# Patient Record
Sex: Female | Born: 1940 | Race: White | Hispanic: No | Marital: Married | Smoking: Former smoker
Health system: Southern US, Community
[De-identification: ages and names within clinical notes are randomized; demographics above are authoritative.]

---

## 2009-04-20 ENCOUNTER — Encounter: Admission: RE | Admit: 2009-04-20 | Discharge: 2009-04-20 | Payer: Self-pay | Admitting: Neurosurgery

## 2009-06-08 ENCOUNTER — Inpatient Hospital Stay (HOSPITAL_COMMUNITY): Admission: RE | Admit: 2009-06-08 | Discharge: 2009-06-12 | Payer: Self-pay | Admitting: Neurosurgery

## 2009-11-10 ENCOUNTER — Emergency Department (HOSPITAL_COMMUNITY): Admission: EM | Admit: 2009-11-10 | Discharge: 2009-11-10 | Payer: Self-pay | Admitting: Emergency Medicine

## 2009-11-15 ENCOUNTER — Encounter: Admission: RE | Admit: 2009-11-15 | Discharge: 2009-11-15 | Payer: Self-pay | Admitting: Pulmonary Disease

## 2009-11-15 ENCOUNTER — Ambulatory Visit: Payer: Self-pay | Admitting: Pulmonary Disease

## 2009-11-15 DIAGNOSIS — J984 Other disorders of lung: Secondary | ICD-10-CM | POA: Insufficient documentation

## 2009-11-15 DIAGNOSIS — E785 Hyperlipidemia, unspecified: Secondary | ICD-10-CM

## 2009-11-15 DIAGNOSIS — J309 Allergic rhinitis, unspecified: Secondary | ICD-10-CM | POA: Insufficient documentation

## 2009-11-15 DIAGNOSIS — R0602 Shortness of breath: Secondary | ICD-10-CM

## 2009-11-15 DIAGNOSIS — I10 Essential (primary) hypertension: Secondary | ICD-10-CM | POA: Insufficient documentation

## 2010-03-08 ENCOUNTER — Encounter: Admission: RE | Admit: 2010-03-08 | Discharge: 2010-03-08 | Payer: Self-pay | Admitting: Neurosurgery

## 2010-03-25 ENCOUNTER — Encounter: Admission: RE | Admit: 2010-03-25 | Discharge: 2010-03-25 | Payer: Self-pay | Admitting: Neurosurgery

## 2010-04-05 ENCOUNTER — Inpatient Hospital Stay (HOSPITAL_COMMUNITY): Admission: RE | Admit: 2010-04-05 | Discharge: 2010-04-08 | Payer: Self-pay | Admitting: Neurosurgery

## 2010-06-02 ENCOUNTER — Emergency Department (HOSPITAL_COMMUNITY): Admission: EM | Admit: 2010-06-02 | Discharge: 2010-06-02 | Payer: Self-pay | Admitting: Emergency Medicine

## 2010-07-10 ENCOUNTER — Encounter: Admission: RE | Admit: 2010-07-10 | Discharge: 2010-07-10 | Payer: Self-pay | Admitting: Neurosurgery

## 2010-07-28 IMAGING — CR DG CHEST 2V
2 series · 2 of 2 positions shown · non-contrast
Comparison: None

CLINICAL DATA: Pre admit for lumbar spondylosis

CHEST - 2 VIEW

[view not recorded (1 of 2)]
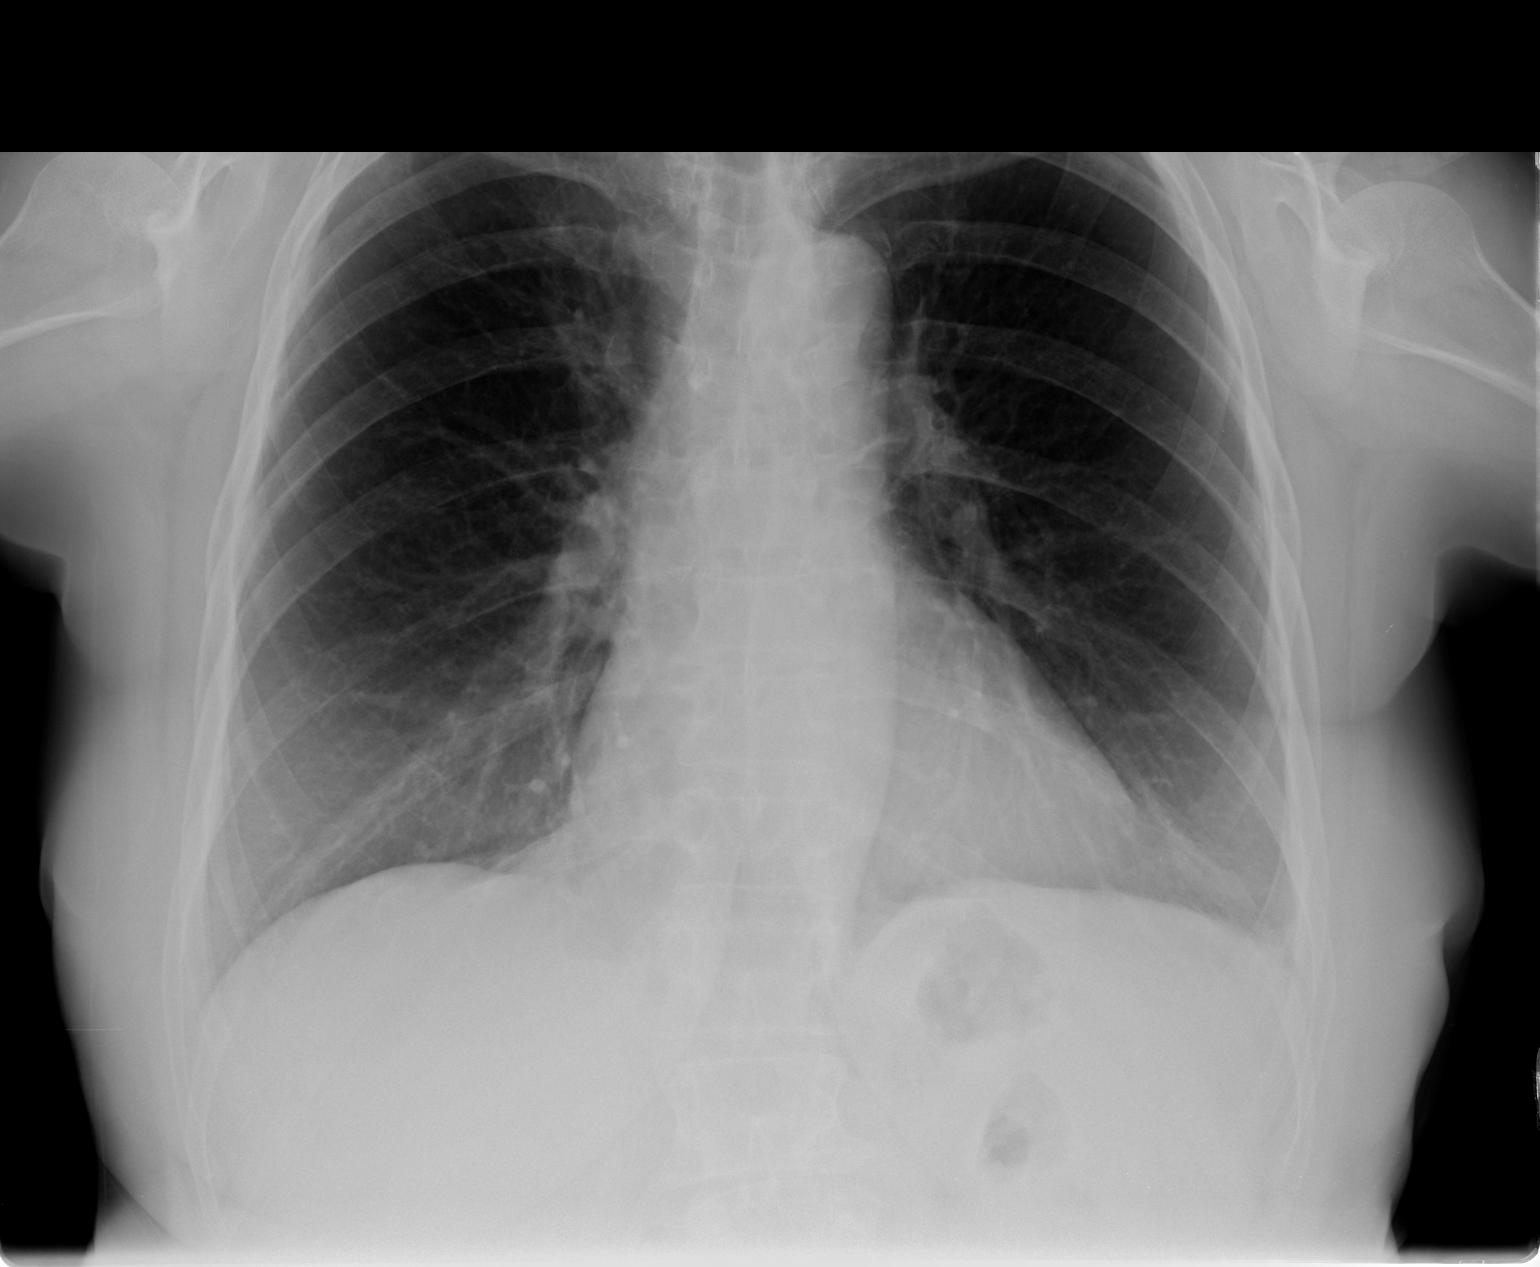

[view not recorded (2 of 2)]
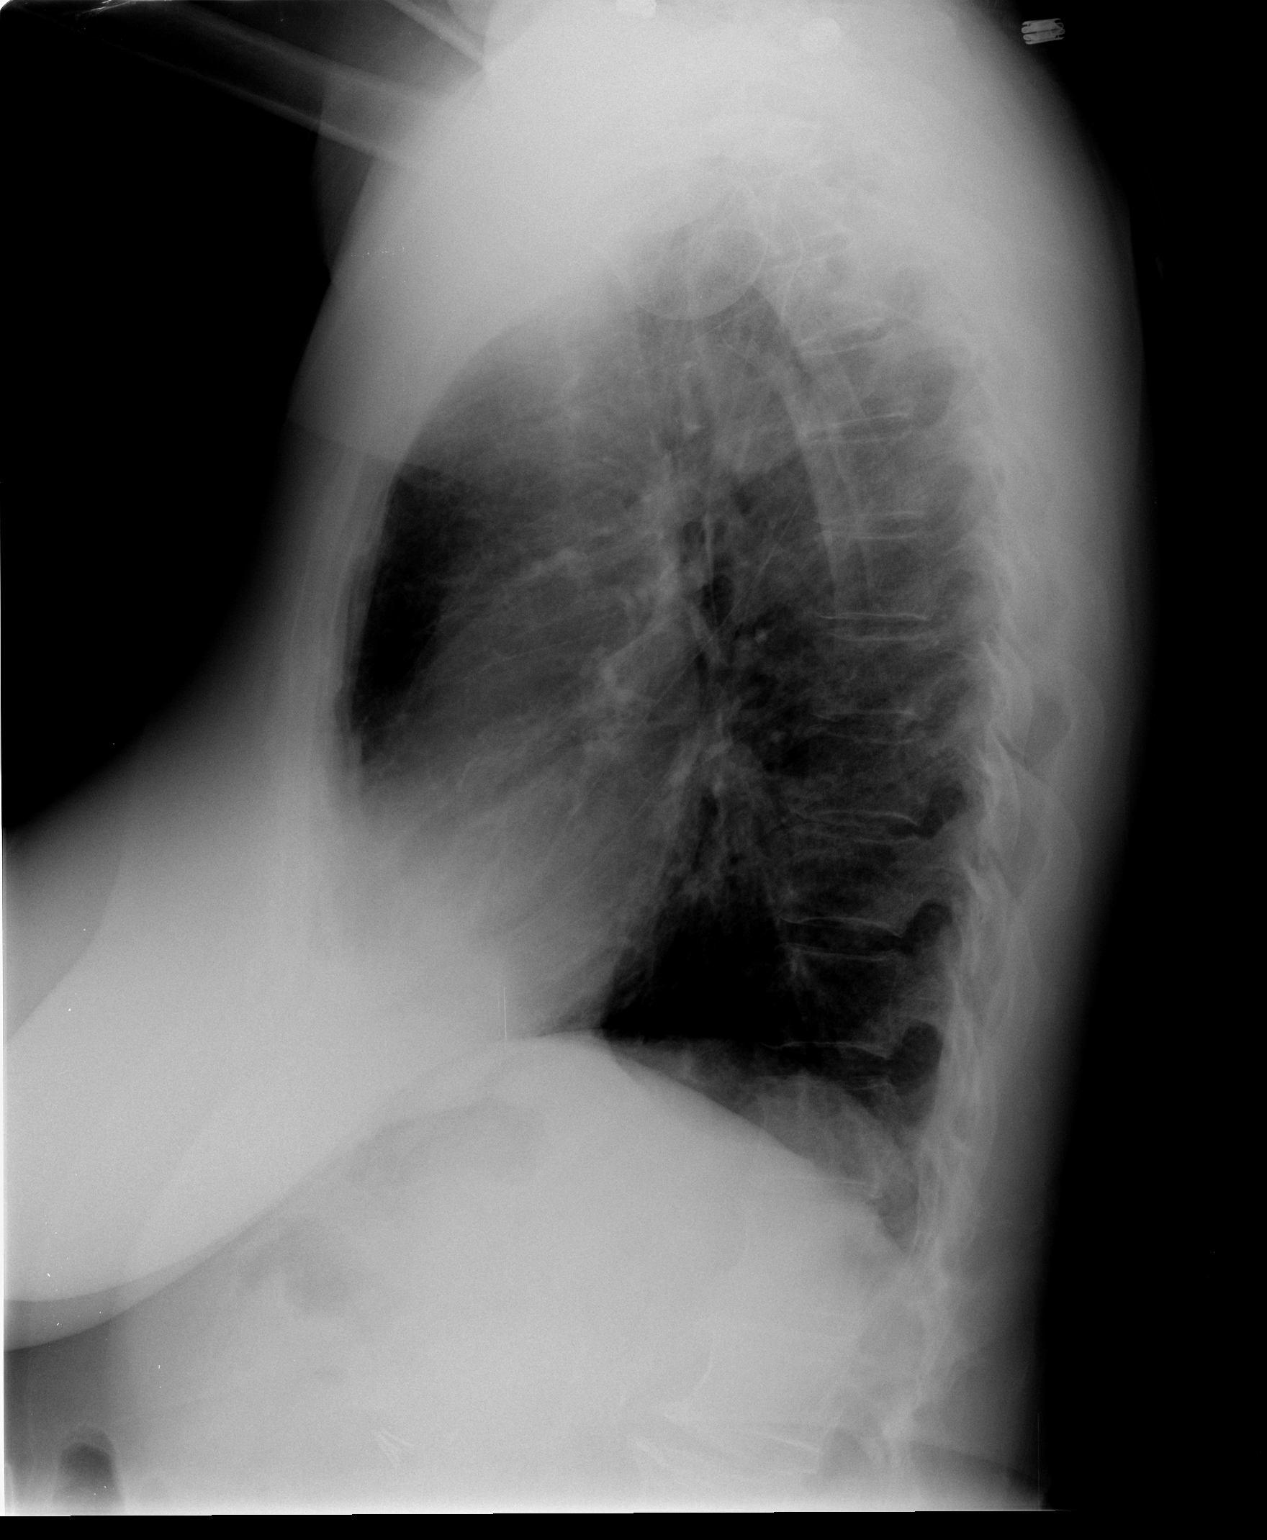

[2 of 2 positions shown; findings below may reference images not displayed]

FINDINGS: Heart size upper normal.  Lungs clear.  Osseous
structures intact.  No pleural fluid.
IMPRESSION: No active disease.

## 2010-08-01 IMAGING — RF DG LUMBAR SPINE 2-3V
1 series · 2 of 2 positions shown · non-contrast
Comparison: None

CLINICAL DATA: L4-L5 anterolateral fusion.

LUMBAR SPINE - 2-3 VIEW

[Series 1: run · 2 of 2 slices shown]
[im 1/2]
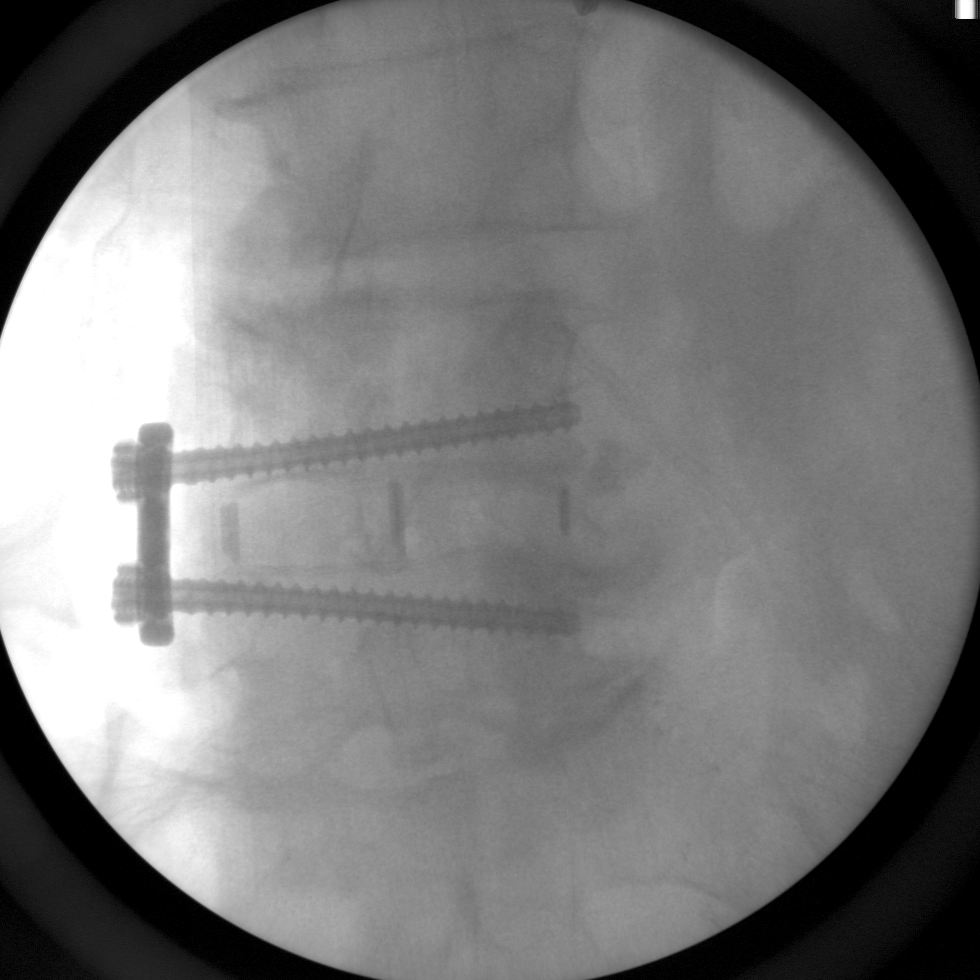
[im 2/2]
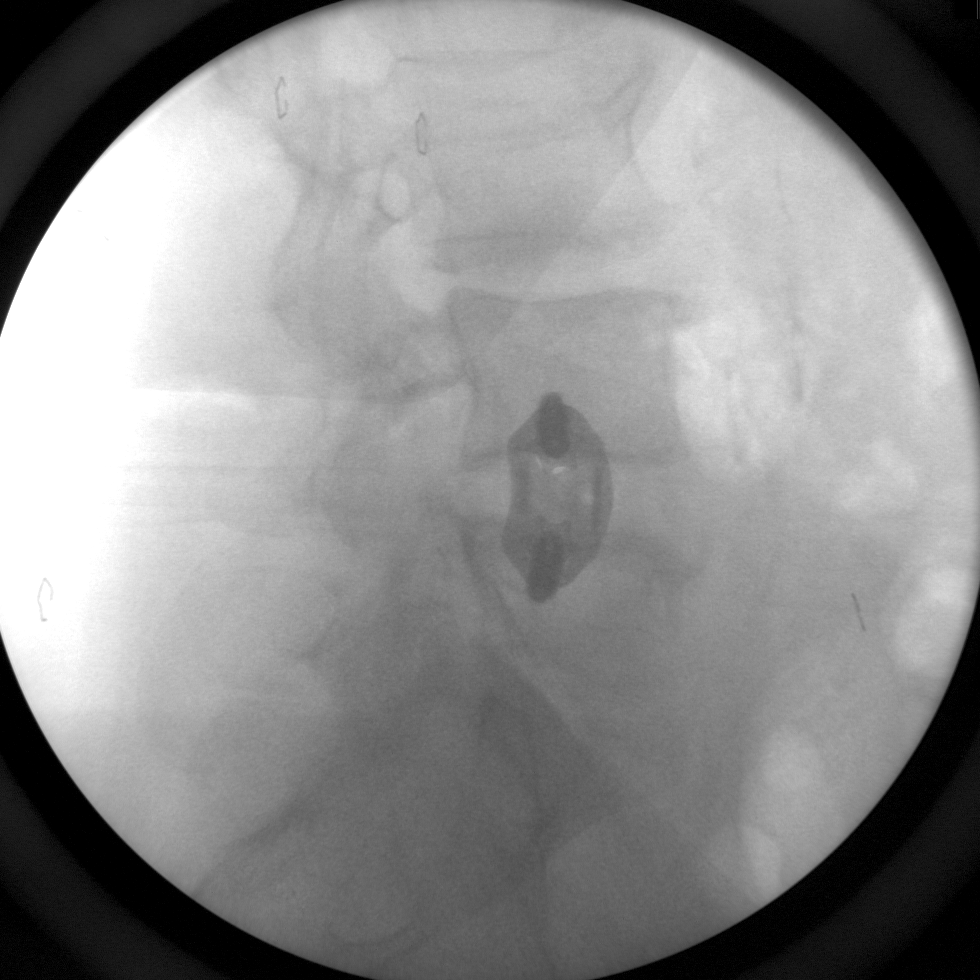

[2 of 2 positions shown; findings below may reference images not displayed]

FINDINGS: Spot AP and lateral fluoroscopic images demonstrate
anterolateral fusion at L4-L5 with an interbody spacer and a
lateral plate and screws.  The hardware appears well positioned.
No complications are evident.
IMPRESSION: Intraoperative views following L4-L5 anterolateral fusion.

## 2010-11-19 NOTE — Assessment & Plan Note (Signed)
Summary: consult for pulmonary nodule and dyspnea   Copy to:  Foye Deer Primary Provider/Referring Provider:  Foye Deer  CC:  Pulmonary Consult.  History of Present Illness: The pt is a 70y/o female who comes in today for evaluation of a pulmonary nodule and dyspnea.  She had a "cold" 4 weeks ago with cough, congestion, and purulence.  She has been treated with abx times 2, and also steroid therapy.  Despite this, she has been having significant dyspnea at rest and with exertion.  She is not coughing or bringing up mucus, but feels that she cannot get her breath at times.  She denies any hemoptysis or chest pain.  She does have a lot of postnasal drip, and feels that she is choking in her throat.  She denies any h/o cardiac disease.  She has been seen in ER, where cxr shows a ?RUL nodular density not obviously present on prior films.  She denies ever living in [place] or [place], and tells me that her health maintenance is up to date.  She has never had TB exposure, and has had a negative ppd in the past.  She has a smoking history of 26yrs, but none the last 10 years.  Preventive Screening-Counseling & Management  Alcohol-Tobacco     Smoking Status: quit  Current Medications (verified): 1)  Ventolin Hfa 108 (90 Base) Mcg/act  Aers (Albuterol Sulfate) .Marland Kitchen.. 1-2 Puffs Every 4-6 Hours As Needed 2)  Labetalol Hcl 200 Mg Tabs (Labetalol Hcl) .... Take 1 Tablet By Mouth Three Times A Day 3)  Hydrochlorothiazide 25 Mg Tabs (Hydrochlorothiazide) .... Take 1 1/2 Tabs By Mouth Daily 4)  Klor-Con M20 20 Meq Cr-Tabs (Potassium Chloride Crys Cr) .... Take 1 Tablet By Mouth Once A Day 5)  Lexapro 20 Mg Tabs (Escitalopram Oxalate) .... Take 1 Tablet By Mouth Once A Day 6)  Crestor 40 Mg Tabs (Rosuvastatin Calcium) .... Take 1 Tablet By Mouth Once A Day 7)  Vitamin C 500 Mg Tabs (Ascorbic Acid) .... Take 1 Tablet By Mouth Once A Day  Allergies (verified): 1)  ! Benadryl 2)  ! *  Promethazine 3)  ! * Cefzil  Past History:  Past Medical History:   HYPERTENSION (ICD-401.9) HYPERLIPIDEMIA (ICD-272.4) ALLERGIC RHINITIS (ICD-477.9)    Past Surgical History: gall bladder 1991 back surgery Aug 2010 Sinus surgery approx early 1990s Hysterectomy approx 1986  Family History: Reviewed history and no changes required. allergies: daughter cancer: brother (lungs, brain) sister (kidney)   Social History: Reviewed history and no changes required. Patient states former smoker.  started at age 16s.  less than 1/2 ppd.  quit 2001. pt is married. pt has children. pt is a retired Associate Professor.  Smoking Status:  quit  Review of Systems       The patient complains of shortness of breath with activity, productive cough, non-productive cough, indigestion, loss of appetite, nasal congestion/difficulty breathing through nose, sneezing, anxiety, and depression.  The patient denies shortness of breath at rest, coughing up blood, chest pain, irregular heartbeats, acid heartburn, weight change, abdominal pain, difficulty swallowing, sore throat, tooth/dental problems, headaches, itching, ear ache, hand/feet swelling, joint stiffness or pain, rash, change in color of mucus, and fever.    Vital Signs:  Patient profile:   70 year old female Height:      70 inches Weight:      175.13 pounds BMI:     25.22 O2 Sat:      99 % on Room air  Temp:     97.4 degrees F oral Pulse rate:   59 / minute BP sitting:   124 / 74  (left arm) Cuff size:   regular  Vitals Entered By: Arman Filter LPN (November 15, 2009 3:08 PM)  O2 Flow:  Room air CC: Pulmonary Consult Comments Medications reviewed with patient Arman Filter LPN  November 15, 2009 3:08 PM    Physical Exam  General:  ow female in nad Eyes:  PERRLA and EOMI.   Nose:  patent without discharge Mouth:  clear, no abnormalities. Neck:  soft and nontender, bs+ Lungs:  totally clear to auscultation Heart:  rrr, no  mrg Abdomen:  soft and nontender, bs+ Extremities:  no significant edema, no cyanosis pulses intact distally Neurologic:  alert and oriented,moves all 4.   Impression & Recommendations:  Problem # 1:  DYSPNEA (ICD-786.05) the pt has no obvious pulmonary reason for her sob.  She has a clear cxr, clear lung fields on exam, excellent sats on room air, and normal spirometry today.  This may be a "perception of breathing" issue, and she later told me that she has panic attacks and takes lexapro and valium.  The only other consideration is whether she could have a cardiac issue.  I will leave that to her primary md.  Problem # 2:  PULMONARY NODULE (ICD-518.89) the pt has a rul nodule on cxr that is not obvious on other films.  She tells me that her health maintenance is up to date, but does have a moderate h/o tobacco abuse.  At this point, she needs a ct chest for better characterization.  Will then decide whether she needs surveillance vs. further workup.  Medications Added to Medication List This Visit: 1)  Ventolin Hfa 108 (90 Base) Mcg/act Aers (Albuterol sulfate) .Marland Kitchen.. 1-2 puffs every 4-6 hours as needed 2)  Labetalol Hcl 200 Mg Tabs (Labetalol hcl) .... Take 1 tablet by mouth three times a day 3)  Hydrochlorothiazide 25 Mg Tabs (Hydrochlorothiazide) .... Take 1 1/2 tabs by mouth daily 4)  Klor-con M20 20 Meq Cr-tabs (Potassium chloride crys cr) .... Take 1 tablet by mouth once a day 5)  Lexapro 20 Mg Tabs (Escitalopram oxalate) .... Take 1 tablet by mouth once a day 6)  Crestor 40 Mg Tabs (Rosuvastatin calcium) .... Take 1 tablet by mouth once a day 7)  Vitamin C 500 Mg Tabs (Ascorbic acid) .... Take 1 tablet by mouth once a day  Other Orders: Consultation Level V (16109) Radiology Referral (Radiology) Spirometry w/Graph (60454)  Patient Instructions: 1)  will schedule for ct chest.  I will call with results when available. 2)  can take chlorpheniramine 8mg  at bedtime for postnasal  drip 3)  I do not see a pulmonary reason for your shortness of breath at this time.   CardioPerfect Spirometry  ID: [id number] Patient: Sandy Wood, Sandy Wood DOB: 06/09/1941 Age: 70 Years Old Sex: Female Race: White Height: 70 Weight: 175.13 Status: Confirmed Recorded: 11/15/2009 4:00 PM  Parameter  Measured Predicted %Predicted FVC     3.29        3.82        86.10 FEV1     2.32        2.92        79.30 FEV1%   70.36        76.57        91.90 PEF    5.79        6.78  85.50   Interpretation: spirometry is normal

## 2011-01-03 LAB — URINALYSIS, ROUTINE W REFLEX MICROSCOPIC
Glucose, UA: NEGATIVE mg/dL
Ketones, ur: NEGATIVE mg/dL
Protein, ur: NEGATIVE mg/dL

## 2011-01-03 LAB — CBC
HCT: 41.1 % (ref 36.0–46.0)
Hemoglobin: 14.4 g/dL (ref 12.0–15.0)
MCHC: 35 g/dL (ref 30.0–36.0)
WBC: 12.8 10*3/uL — ABNORMAL HIGH (ref 4.0–10.5)

## 2011-01-03 LAB — POCT CARDIAC MARKERS: Myoglobin, poc: 73 ng/mL (ref 12–200)

## 2011-01-03 LAB — COMPREHENSIVE METABOLIC PANEL
ALT: 20 U/L (ref 0–35)
Alkaline Phosphatase: 111 U/L (ref 39–117)
CO2: 25 mEq/L (ref 19–32)
GFR calc non Af Amer: >60 mL/min (ref >60)
Glucose, Bld: 109 mg/dL — ABNORMAL HIGH (ref 70–99)
Potassium: 2.9 mEq/L — ABNORMAL LOW (ref 3.5–5.1)
Sodium: 136 mEq/L (ref 135–145)
Total Bilirubin: 0.5 mg/dL (ref 0.3–1.2)

## 2011-01-03 LAB — URINE MICROSCOPIC-ADD ON

## 2011-01-03 LAB — DIFFERENTIAL
Basophils Relative: 0 % (ref 0–1)
Eosinophils Absolute: 0.1 10*3/uL (ref 0.0–0.7)
Neutrophils Relative %: 77 % (ref 43–77)

## 2011-01-05 LAB — COMPREHENSIVE METABOLIC PANEL
ALT: 20 U/L (ref 0–35)
AST: 21 U/L (ref 0–37)
Albumin: 4.2 g/dL (ref 3.5–5.2)
Alkaline Phosphatase: 79 U/L (ref 39–117)
Alkaline Phosphatase: 87 U/L (ref 39–117)
BUN: 9 mg/dL (ref 6–23)
Calcium: 9.8 mg/dL (ref 8.4–10.5)
Chloride: 103 mEq/L (ref 96–112)
GFR calc Af Amer: >60 mL/min (ref >60)
Glucose, Bld: 102 mg/dL — ABNORMAL HIGH (ref 70–99)
Potassium: 3.4 mEq/L — ABNORMAL LOW (ref 3.5–5.1)
Potassium: 3.5 mEq/L (ref 3.5–5.1)
Sodium: 137 mEq/L (ref 135–145)
Total Bilirubin: 0.4 mg/dL (ref 0.3–1.2)
Total Protein: 6.2 g/dL (ref 6.0–8.3)

## 2011-01-05 LAB — POCT CARDIAC MARKERS
CKMB, poc: 1.2 ng/mL (ref 1.0–8.0)
Myoglobin, poc: 71.2 ng/mL (ref 12–200)
Troponin i, poc: <0.05 ng/mL (ref 0.00–0.09)

## 2011-01-05 LAB — PROTIME-INR: INR: 0.96 (ref 0.00–1.49)

## 2011-01-05 LAB — DIFFERENTIAL
Basophils Relative: 1 % (ref 0–1)
Eosinophils Absolute: 0.1 10*3/uL (ref 0.0–0.7)
Eosinophils Relative: 1 % (ref 0–5)
Eosinophils Relative: 1 % (ref 0–5)
Lymphocytes Relative: 16 % (ref 12–46)
Lymphs Abs: 1.7 10*3/uL (ref 0.7–4.0)
Lymphs Abs: 1.9 10*3/uL (ref 0.7–4.0)
Monocytes Absolute: 0.9 10*3/uL (ref 0.1–1.0)
Monocytes Relative: 7 % (ref 3–12)
Neutro Abs: 7.7 10*3/uL (ref 1.7–7.7)
Neutrophils Relative %: 75 % (ref 43–77)

## 2011-01-05 LAB — CBC
HCT: 41.6 % (ref 36.0–46.0)
HCT: 39.2 % (ref 36.0–46.0)
Hemoglobin: 13.8 g/dL (ref 12.0–15.0)
MCV: 91.1 fL (ref 78.0–100.0)
Platelets: 196 10*3/uL (ref 150–400)
Platelets: 211 10*3/uL (ref 150–400)
RDW: 12.9 % (ref 11.5–15.5)
RDW: 13.1 % (ref 11.5–15.5)
WBC: 10.3 10*3/uL (ref 4.0–10.5)
WBC: 13.4 10*3/uL — ABNORMAL HIGH (ref 4.0–10.5)

## 2011-01-05 LAB — URINALYSIS, ROUTINE W REFLEX MICROSCOPIC
Ketones, ur: NEGATIVE mg/dL
Nitrite: NEGATIVE
Specific Gravity, Urine: 1.008 (ref 1.005–1.030)
pH: 6 (ref 5.0–8.0)

## 2011-01-05 LAB — BASIC METABOLIC PANEL
BUN: 6 mg/dL (ref 6–23)
Calcium: 9.7 mg/dL (ref 8.4–10.5)
GFR calc non Af Amer: >60 mL/min (ref >60)
Potassium: 3.4 mEq/L — ABNORMAL LOW (ref 3.5–5.1)
Sodium: 135 mEq/L (ref 135–145)

## 2011-01-05 LAB — BRAIN NATRIURETIC PEPTIDE: Pro B Natriuretic peptide (BNP): 84 pg/mL (ref 0.0–100.0)

## 2011-01-05 LAB — URINE MICROSCOPIC-ADD ON

## 2011-01-05 LAB — TYPE AND SCREEN

## 2011-01-05 LAB — GLUCOSE, CAPILLARY: Glucose-Capillary: 113 mg/dL — ABNORMAL HIGH (ref 70–99)

## 2011-01-05 LAB — SURGICAL PCR SCREEN: MRSA, PCR: NEGATIVE

## 2011-01-25 LAB — COMPREHENSIVE METABOLIC PANEL
ALT: 21 U/L (ref 0–35)
Alkaline Phosphatase: 82 U/L (ref 39–117)
BUN: 5 mg/dL — ABNORMAL LOW (ref 6–23)
Chloride: 96 mEq/L (ref 96–112)
Glucose, Bld: 87 mg/dL (ref 70–99)
Potassium: 3.8 mEq/L (ref 3.5–5.1)
Sodium: 134 mEq/L — ABNORMAL LOW (ref 135–145)
Total Bilirubin: 0.7 mg/dL (ref 0.3–1.2)
Total Protein: 6.1 g/dL (ref 6.0–8.3)

## 2011-01-25 LAB — DIFFERENTIAL
Basophils Absolute: 0 10*3/uL (ref 0.0–0.1)
Basophils Relative: 1 % (ref 0–1)
Eosinophils Absolute: 0.1 10*3/uL (ref 0.0–0.7)
Monocytes Absolute: 0.6 10*3/uL (ref 0.1–1.0)
Monocytes Relative: 8 % (ref 3–12)
Neutro Abs: 5.2 10*3/uL (ref 1.7–7.7)
Neutrophils Relative %: 67 % (ref 43–77)

## 2011-01-25 LAB — URINALYSIS, ROUTINE W REFLEX MICROSCOPIC
Ketones, ur: NEGATIVE mg/dL
Nitrite: NEGATIVE
Protein, ur: NEGATIVE mg/dL
Urobilinogen, UA: 0.2 mg/dL (ref 0.0–1.0)

## 2011-01-25 LAB — APTT: aPTT: 28 seconds (ref 24–37)

## 2011-01-25 LAB — CBC
MCHC: 35.1 g/dL (ref 30.0–36.0)
MCV: 89.2 fL (ref 78.0–100.0)
RDW: 13.5 % (ref 11.5–15.5)

## 2011-01-25 LAB — PROTIME-INR: INR: 0.9 (ref 0.00–1.49)

## 2011-01-25 LAB — TYPE AND SCREEN

## 2011-01-25 LAB — ABO/RH: ABO/RH(D): A NEG

## 2011-03-04 NOTE — H&P (Signed)
NAMEDANASIA, BAKER NO.:  [id number]   MEDICAL RECORD NO.:  [id number]          PATIENT TYPE:  INP   LOCATION:  3028                         FACILITY:  [facility]   PHYSICIAN:  Payton Doughty, M.D.      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   BODY OF TEXT:  A 70 year old right-handed white lady who was having  increasing pain in her back, buttock, down her left leg, and the dorsum  of left foot, it started gradually and getting worse when she did  yesterday yard work.  She underwent a facet block at L4-5 that  completely relieved her symptoms for about 2 weeks, mainly similar to  how they were before block.  She is, therefore, now admitted for a L3-4  XLIF.   MEDICAL HISTORY:  Benign.   She uses hydrochlorothiazide, potassium, Crestor, Normodyne, Valium, and  Lorcet Plus.   She is allergic to PROMETHAZINE, BENADRYL, and PROCARDIA.   PAST SURGICAL HISTORY:  Cholecystectomy.   FAMILY HISTORY:  Both parents are deceased with history of hypertension  and cancer between them.   REVIEW OF SYSTEMS:  Remarkable for hypertension, high cholesterol, leg  pain when she is walking, back pain, and arthritis.   PHYSICAL EXAMINATION:  HEENT:  Within normal limits.  NECK:  She has good range of motion of the neck.  CHEST:  Clear.  CARDIAC:  Regular rate and rhythm.  ABDOMEN:  Somewhat large, but nontender with no hepatosplenomegaly.  EXTREMITIES:  Without clubbing or cyanosis.  GU:  Deferred.  Peripheral pulses are good.  NEUROLOGIC:  She is awake, alert, and oriented.  Cranial nerves are  intact.  Motor exam shows 5/5 strength throughout the upper and lower  extremities.  Sensory, dysesthesias as described in the left L5 and is  quite a bit in L4 distribution.  Reflexes are 2 at the knees, 1 at the  right ankle, absent at the left ankle.   Straight leg is negative.   MR demonstrates spondylosis at several levels, most of L3-4 and L4-5.  At L4-5, she has significant lateral recess narrowing and impingement on  the left L5 root.  Plain films demonstrated acquired scoliosis with  lateral deformity of the left at L4-5 and L3-4.  The L2-3 has a small  amount of deformity, but seems more to be related with the deformity at  the levels below it.   CLINICAL IMPRESSION:  L5 radiculopathy related to spondylosis.   The plan is for an XLIF done from the right side at T4-5 and with  lateral release to allow opening in the neuroforamen and afford release  to the L5 nerve root as it tranverses this area.  The risks and benefits  of this approach have been discussed with her, and she wishes to  proceed.           ______________________________  Payton Doughty, M.D.     MWR/MEDQ  D:  06/08/2009  T:  06/09/2009  Job:  [id number]

## 2011-03-04 NOTE — Op Note (Signed)
NAMEARLANDA, Sandy Wood NO.:  [id number]   MEDICAL RECORD NO.:  [id number]          PATIENT TYPE:  INP   LOCATION:  3028                         FACILITY:  [facility]   PHYSICIAN:  Payton Doughty, M.D.      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   PROCEDURE:  L4-5 anterolateral antibody fusion using an XLIF technique.   SURGEON:  Payton Doughty, MD, Neurosurgery.   ANESTHESIA:  General endotracheal.   PREPARATION:  Prepped and draped with alcohol wipe.   COMPLICATIONS:  None.   NURSE ASSISTANT:  Kathleene Hazel, RN   DOCTOR ASSISTANT:  Danae Orleans. Venetia Maxon, MD.   BODY OF TEST:  A 70 year old lady with left L4 radiculopathy related to  severe spondylosis and lateral defect at L4-5, taken to the operative  room, smoothly anesthetized and intubated, placed left side down right  side up lateral decubitus position on the OR table with an axillary  roll.  Pressure points were padded.  The patient was tapped.  She was  prepared for EMG monitoring on the table, flexed to allow access to the  L4-5 interspace over the right iliac crest.  Intraoperative fluoroscopy  and EMG monitoring were used to guide trocar through the lateral  abdominal musculature into the retroperitoneal space onto the L4-5  interspace.  Steinmann pin was placed and successive dilation was  carried out over it under EMG monitoring.  The retractor system was  placed and opened.  Shim was placed after ascertaining nerve roots were  not in the way.  Once shim was placed, retractors were opened, light  sources were placed.  Field was once scanned with hand electrode and  found to be open.  Diskectomy was carried out at L4-5 with all the  instrumentation including Cobbs and endplate scrapers and sanders.  After preparation of  endplates, interbody grafts were incised and it was  found that a 10 lordotic, admitting 10 anterior and 8 posterior, the  device was placed.  It was 55 mm wide and 18 mm deep.  It was packed  with Osteocel and tapped into place under fluoroscopic guidance.  Two  screws were then placed in the vertebral bodies, one in L4, one in the  L5 and across it was placed an 8-mm plate.  Final films showed good  placement of interbody device as well as screws in the lateral  plate.  Final tightener was applied.  The retractor system was withdrawn  and successive layers of 0 Vicryl, 2-0 Vicryl, and 3-0 nylon were used  to close.  Betadine Telfa dressing was applied and made occlusive with  OpSite and the patient returned to the recovery room in good condition.           ______________________________  Payton Doughty, M.D.     MWR/MEDQ  D:  06/08/2009  T:  06/09/2009  Job:  [id number]

## 2011-03-04 NOTE — Discharge Summary (Signed)
Sandy Wood, Sandy Wood                 ACCOUNT NO.:  [id number]   MEDICAL RECORD NO.:  [id number]          PATIENT TYPE:  INP   LOCATION:  3028                         FACILITY:  [facility]   PHYSICIAN:  Payton Doughty, M.D.      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   OPERATIVE PROCEDURES:  L4-5 anterolateral interbody fusion using XLIF  technique.   COMPLICATIONS:  None.   DISCHARGE STATUS:  Alive and well.   BODY TO TEXT:  This is a 70 year old right-handed white lady whose  history and physical is recounted in the chart.  She has had spondylosis  for sometime, got a lot of relief from the L4-5 facet block.  Her pain  has recurred and now she is admitted for an XLIF at L4-5.  Medical  history is benign.  General exam is intact.  Neurologic exam was intact  with left L4 radiculopathy.  She was admitted after ascertaining normal  laboratory values and underwent a L4-5 XLIF with lateral plate.  Postoperatively, she has done well.  It was done from the right side.  She did develop some right leg pain, but it is definitely on the wean  now.  Neurontin seems to have help.  She is able to get her brace off  and on, eating and voiding normally.  Her incision is dry and well  healing.  She is being discharged home in the care of her family on  Neurontin and Percocet.  Her followup will be in the [facility] in  a week for sutures.           ______________________________  Payton Doughty, M.D.     MWR/MEDQ  D:  06/12/2009  T:  06/13/2009  Job:  [id number]

## 2011-05-29 IMAGING — RF DG LUMBAR SPINE 2-3V
1 series · 2 of 2 positions shown · non-contrast
Comparison: MRI 03/08/2010.

CLINICAL DATA: 68-year-old female undergoing lumbar surgery.

LUMBAR SPINE - 2-3 VIEW

[Series 1: run · 2 of 2 slices shown]
[im 1/2]
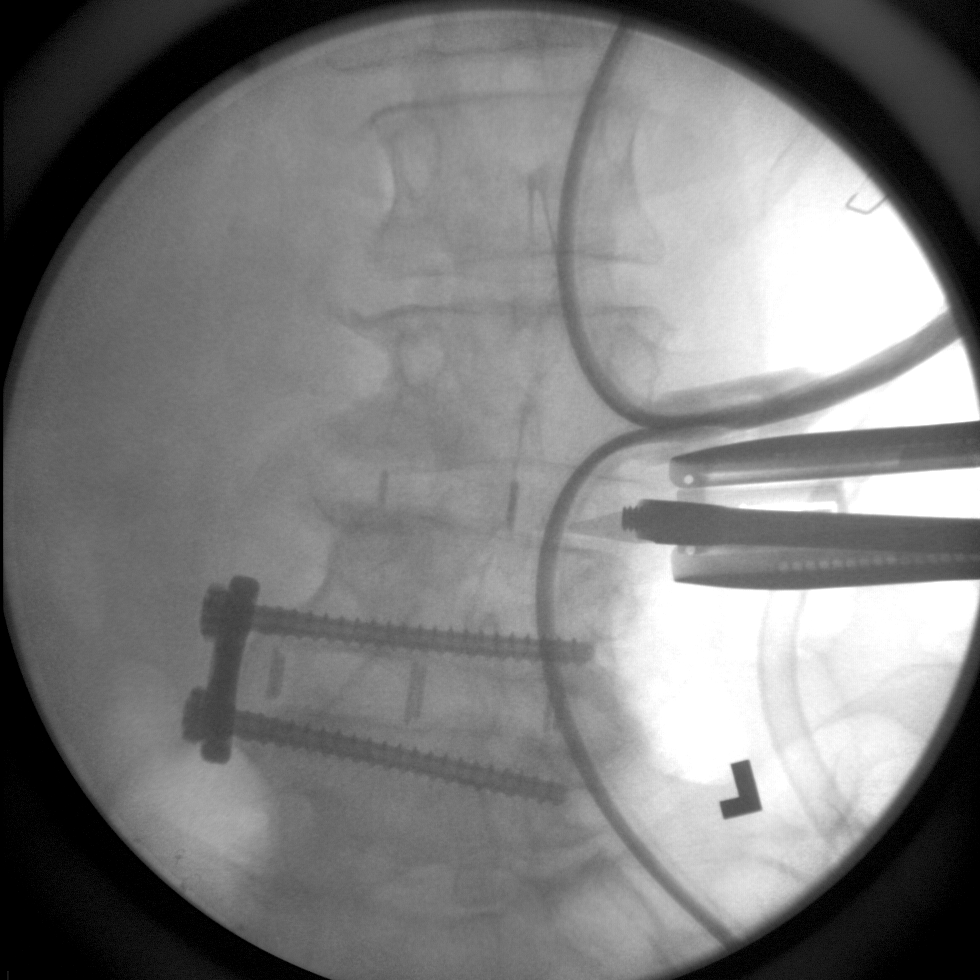
[im 2/2]
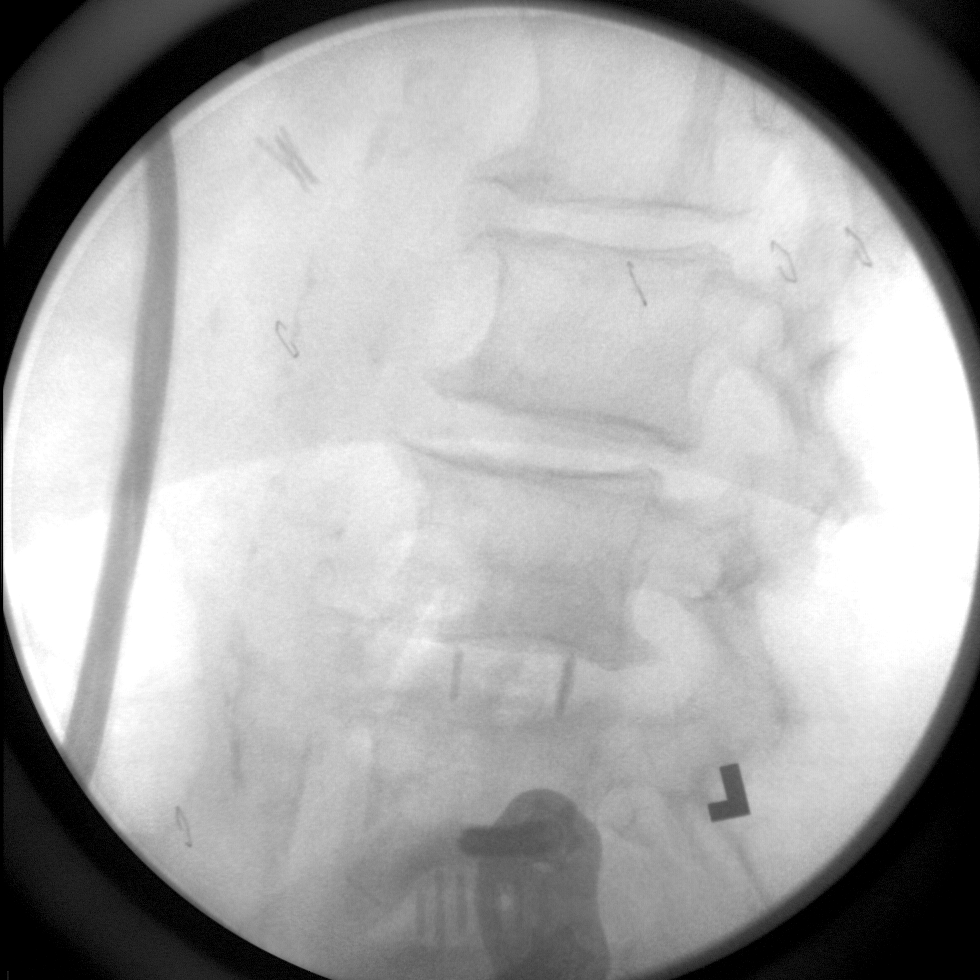

[2 of 2 positions shown; findings below may reference images not displayed]

FINDINGS: Intraoperative fluoroscopic spot views of the lumbar
spine in the frontal and lateral projection.  Sequelae of interbody
fusion re-identified at L4-L5 including lateral hardware and
interbody implant.  These images depict addition of an interbody
implant at L3-L4.
IMPRESSION: Extension of interbody fusion to L3-L4 depicted.

## 2011-07-26 IMAGING — CR DG CHEST 2V
2 series · 2 of 2 positions shown · non-contrast
Comparison: 11/10/2009.

CLINICAL DATA: Hypertension.  Arm and facial numbness.

CHEST - 2 VIEW

[w chest pa]
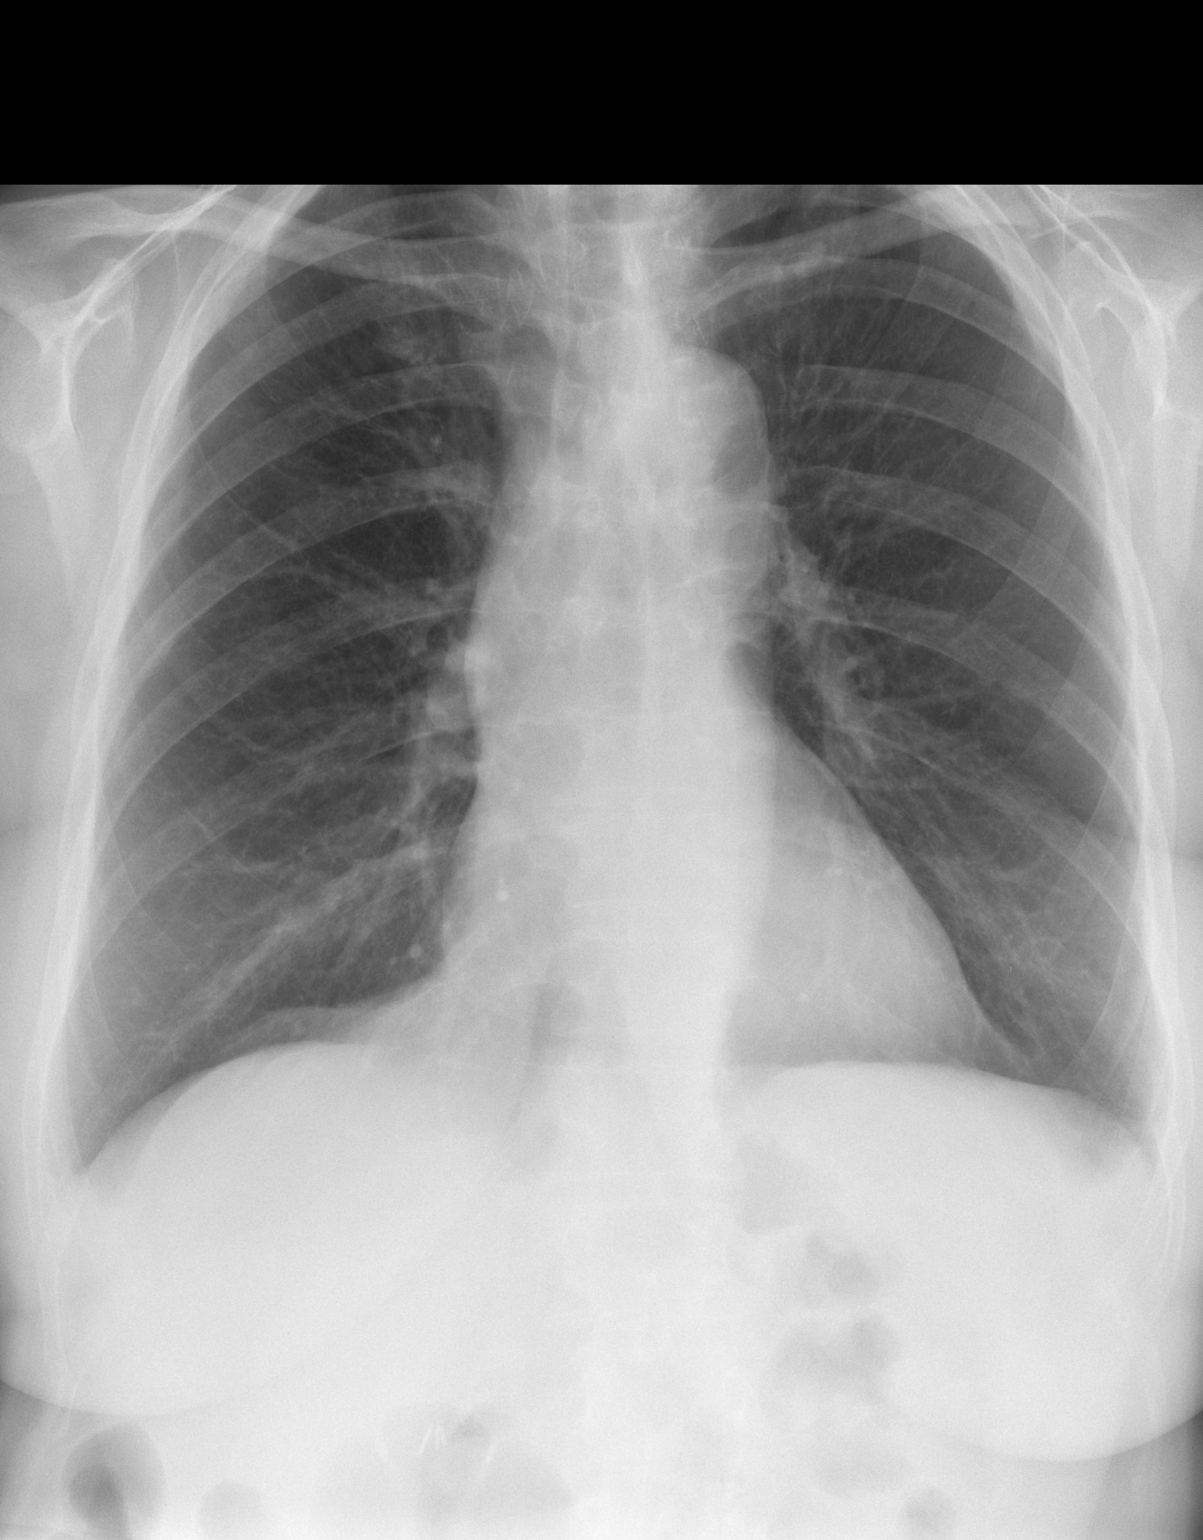

[w chest lat]
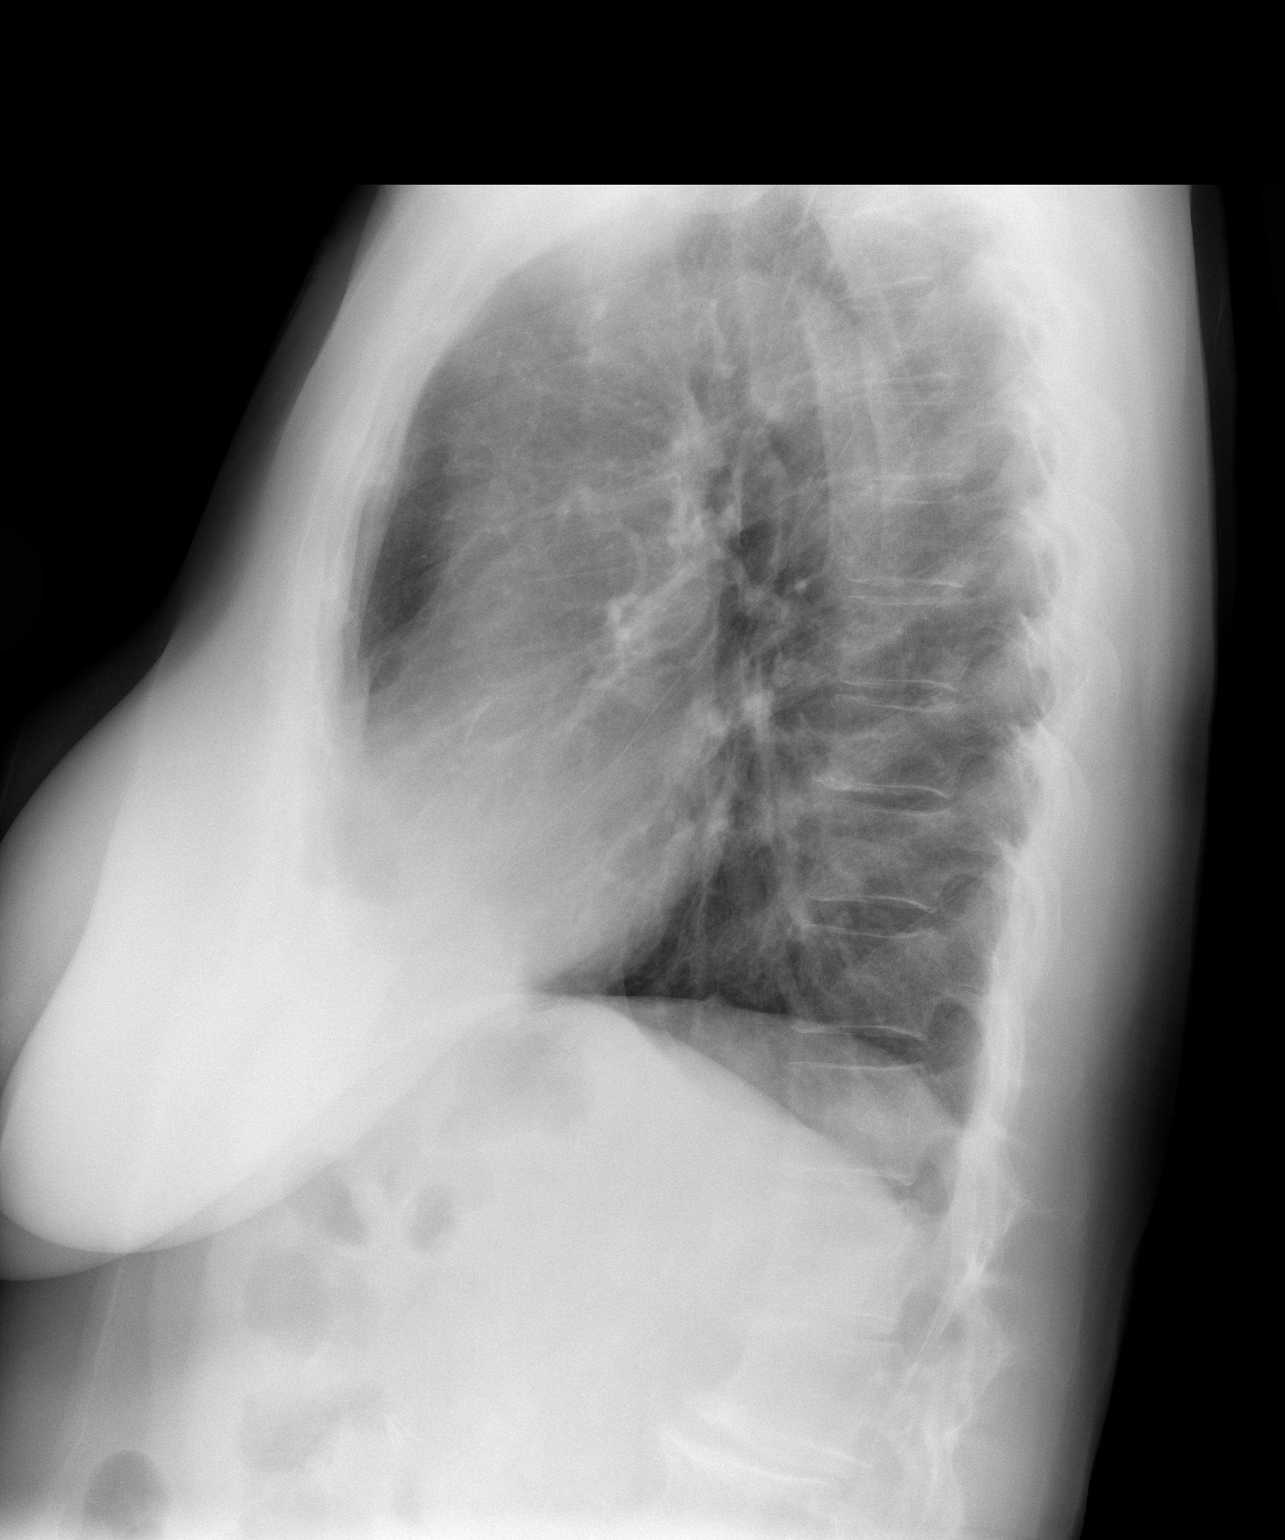

[2 of 2 positions shown; findings below may reference images not displayed]

FINDINGS: Mild hyperinflation consistent with emphysema.  There is
no airspace disease.  Bilateral pleural apical thickening.  Mild
tortuosity of the thoracic aorta.  Cardiopericardial silhouette
appears within normal limits.  No airspace disease or effusion.
Thoracolumbar spondylosis.
IMPRESSION: Hyperinflation without acute cardiopulmonary disease.

## 2012-07-08 ENCOUNTER — Other Ambulatory Visit: Payer: Self-pay | Admitting: Neurosurgery

## 2012-07-08 DIAGNOSIS — M47817 Spondylosis without myelopathy or radiculopathy, lumbosacral region: Secondary | ICD-10-CM

## 2012-07-08 DIAGNOSIS — M48061 Spinal stenosis, lumbar region without neurogenic claudication: Secondary | ICD-10-CM

## 2012-07-08 DIAGNOSIS — M5137 Other intervertebral disc degeneration, lumbosacral region: Secondary | ICD-10-CM

## 2012-07-08 DIAGNOSIS — M4120 Other idiopathic scoliosis, site unspecified: Secondary | ICD-10-CM

## 2012-07-14 ENCOUNTER — Ambulatory Visit
Admission: RE | Admit: 2012-07-14 | Discharge: 2012-07-14 | Disposition: A | Discharge status: Home / Self Care | Payer: Medicare Other | Source: Ambulatory Visit | Attending: Neurosurgery | Admitting: Neurosurgery

## 2012-07-14 DIAGNOSIS — M48061 Spinal stenosis, lumbar region without neurogenic claudication: Secondary | ICD-10-CM

## 2012-07-14 DIAGNOSIS — M4120 Other idiopathic scoliosis, site unspecified: Secondary | ICD-10-CM

## 2012-07-14 DIAGNOSIS — M5137 Other intervertebral disc degeneration, lumbosacral region: Secondary | ICD-10-CM

## 2012-07-14 DIAGNOSIS — M47817 Spondylosis without myelopathy or radiculopathy, lumbosacral region: Secondary | ICD-10-CM

## 2012-07-14 MED ORDER — GADOBENATE DIMEGLUMINE 529 MG/ML IV SOLN
16.0000 mL | Freq: Once | INTRAVENOUS | Status: AC | PRN
  Administered 2012-07-14: 16 mL via INTRAVENOUS

## 2014-10-24 DIAGNOSIS — I10 Essential (primary) hypertension: Secondary | ICD-10-CM | POA: Diagnosis not present

## 2014-10-24 DIAGNOSIS — F411 Generalized anxiety disorder: Secondary | ICD-10-CM | POA: Diagnosis not present

## 2014-10-24 DIAGNOSIS — J3489 Other specified disorders of nose and nasal sinuses: Secondary | ICD-10-CM | POA: Diagnosis not present

## 2014-10-24 DIAGNOSIS — E876 Hypokalemia: Secondary | ICD-10-CM | POA: Diagnosis not present

## 2014-10-24 DIAGNOSIS — E785 Hyperlipidemia, unspecified: Secondary | ICD-10-CM | POA: Diagnosis not present

## 2014-11-02 DIAGNOSIS — M5136 Other intervertebral disc degeneration, lumbar region: Secondary | ICD-10-CM | POA: Diagnosis not present

## 2014-11-02 DIAGNOSIS — M419 Scoliosis, unspecified: Secondary | ICD-10-CM | POA: Diagnosis not present

## 2014-11-02 DIAGNOSIS — M4806 Spinal stenosis, lumbar region: Secondary | ICD-10-CM | POA: Diagnosis not present

## 2014-11-02 DIAGNOSIS — Z981 Arthrodesis status: Secondary | ICD-10-CM | POA: Diagnosis not present

## 2014-11-02 DIAGNOSIS — M4326 Fusion of spine, lumbar region: Secondary | ICD-10-CM | POA: Diagnosis not present

## 2014-11-08 DIAGNOSIS — Z6828 Body mass index (BMI) 28.0-28.9, adult: Secondary | ICD-10-CM | POA: Diagnosis not present

## 2014-11-08 DIAGNOSIS — M545 Low back pain: Secondary | ICD-10-CM | POA: Diagnosis not present

## 2014-11-08 DIAGNOSIS — F411 Generalized anxiety disorder: Secondary | ICD-10-CM | POA: Diagnosis not present

## 2014-11-28 DIAGNOSIS — N390 Urinary tract infection, site not specified: Secondary | ICD-10-CM | POA: Diagnosis not present

## 2014-11-28 DIAGNOSIS — Z6828 Body mass index (BMI) 28.0-28.9, adult: Secondary | ICD-10-CM | POA: Diagnosis not present

## 2014-11-28 DIAGNOSIS — I10 Essential (primary) hypertension: Secondary | ICD-10-CM | POA: Diagnosis not present

## 2014-12-08 DIAGNOSIS — M545 Low back pain: Secondary | ICD-10-CM | POA: Diagnosis not present

## 2014-12-08 DIAGNOSIS — I10 Essential (primary) hypertension: Secondary | ICD-10-CM | POA: Diagnosis not present

## 2014-12-08 DIAGNOSIS — Z6828 Body mass index (BMI) 28.0-28.9, adult: Secondary | ICD-10-CM | POA: Diagnosis not present

## 2014-12-19 DIAGNOSIS — Z9889 Other specified postprocedural states: Secondary | ICD-10-CM | POA: Diagnosis not present

## 2014-12-19 DIAGNOSIS — R2689 Other abnormalities of gait and mobility: Secondary | ICD-10-CM | POA: Diagnosis not present

## 2014-12-19 DIAGNOSIS — M438X9 Other specified deforming dorsopathies, site unspecified: Secondary | ICD-10-CM | POA: Diagnosis not present

## 2014-12-19 DIAGNOSIS — Z981 Arthrodesis status: Secondary | ICD-10-CM | POA: Diagnosis not present

## 2014-12-19 DIAGNOSIS — M5136 Other intervertebral disc degeneration, lumbar region: Secondary | ICD-10-CM | POA: Diagnosis not present

## 2014-12-19 DIAGNOSIS — M419 Scoliosis, unspecified: Secondary | ICD-10-CM | POA: Diagnosis not present

## 2014-12-19 DIAGNOSIS — M4806 Spinal stenosis, lumbar region: Secondary | ICD-10-CM | POA: Diagnosis not present

## 2014-12-25 DIAGNOSIS — N390 Urinary tract infection, site not specified: Secondary | ICD-10-CM | POA: Diagnosis not present

## 2014-12-25 DIAGNOSIS — T50901A Poisoning by unspecified drugs, medicaments and biological substances, accidental (unintentional), initial encounter: Secondary | ICD-10-CM | POA: Diagnosis not present

## 2014-12-25 DIAGNOSIS — I10 Essential (primary) hypertension: Secondary | ICD-10-CM | POA: Diagnosis not present

## 2014-12-25 DIAGNOSIS — F411 Generalized anxiety disorder: Secondary | ICD-10-CM | POA: Diagnosis not present

## 2014-12-25 DIAGNOSIS — E785 Hyperlipidemia, unspecified: Secondary | ICD-10-CM | POA: Diagnosis not present

## 2014-12-28 DIAGNOSIS — Z6828 Body mass index (BMI) 28.0-28.9, adult: Secondary | ICD-10-CM | POA: Diagnosis not present

## 2014-12-28 DIAGNOSIS — L299 Pruritus, unspecified: Secondary | ICD-10-CM | POA: Diagnosis not present

## 2014-12-28 DIAGNOSIS — R600 Localized edema: Secondary | ICD-10-CM | POA: Diagnosis not present

## 2014-12-28 DIAGNOSIS — E876 Hypokalemia: Secondary | ICD-10-CM | POA: Diagnosis not present

## 2014-12-28 DIAGNOSIS — R635 Abnormal weight gain: Secondary | ICD-10-CM | POA: Diagnosis not present

## 2015-01-03 DIAGNOSIS — E876 Hypokalemia: Secondary | ICD-10-CM | POA: Diagnosis not present

## 2015-01-03 DIAGNOSIS — R600 Localized edema: Secondary | ICD-10-CM | POA: Diagnosis not present

## 2015-01-03 DIAGNOSIS — I10 Essential (primary) hypertension: Secondary | ICD-10-CM | POA: Diagnosis not present

## 2015-01-03 DIAGNOSIS — Z6828 Body mass index (BMI) 28.0-28.9, adult: Secondary | ICD-10-CM | POA: Diagnosis not present

## 2015-01-08 DIAGNOSIS — M4120 Other idiopathic scoliosis, site unspecified: Secondary | ICD-10-CM | POA: Diagnosis not present

## 2015-01-10 DIAGNOSIS — Z6828 Body mass index (BMI) 28.0-28.9, adult: Secondary | ICD-10-CM | POA: Diagnosis not present

## 2015-01-10 DIAGNOSIS — M545 Low back pain: Secondary | ICD-10-CM | POA: Diagnosis not present

## 2015-01-10 DIAGNOSIS — M5136 Other intervertebral disc degeneration, lumbar region: Secondary | ICD-10-CM | POA: Diagnosis not present

## 2015-01-10 DIAGNOSIS — J029 Acute pharyngitis, unspecified: Secondary | ICD-10-CM | POA: Diagnosis not present

## 2015-01-11 DIAGNOSIS — Z01810 Encounter for preprocedural cardiovascular examination: Secondary | ICD-10-CM | POA: Diagnosis not present

## 2015-01-11 DIAGNOSIS — E785 Hyperlipidemia, unspecified: Secondary | ICD-10-CM | POA: Diagnosis not present

## 2015-01-11 DIAGNOSIS — I10 Essential (primary) hypertension: Secondary | ICD-10-CM | POA: Diagnosis not present

## 2015-01-17 DIAGNOSIS — M8589 Other specified disorders of bone density and structure, multiple sites: Secondary | ICD-10-CM | POA: Diagnosis not present

## 2015-01-17 DIAGNOSIS — Z6827 Body mass index (BMI) 27.0-27.9, adult: Secondary | ICD-10-CM | POA: Diagnosis not present

## 2015-01-17 DIAGNOSIS — M4120 Other idiopathic scoliosis, site unspecified: Secondary | ICD-10-CM | POA: Diagnosis not present

## 2015-01-17 DIAGNOSIS — N390 Urinary tract infection, site not specified: Secondary | ICD-10-CM | POA: Diagnosis not present

## 2015-01-17 DIAGNOSIS — M810 Age-related osteoporosis without current pathological fracture: Secondary | ICD-10-CM | POA: Diagnosis not present

## 2015-01-17 DIAGNOSIS — Z13820 Encounter for screening for osteoporosis: Secondary | ICD-10-CM | POA: Diagnosis not present

## 2015-01-22 DIAGNOSIS — Z6826 Body mass index (BMI) 26.0-26.9, adult: Secondary | ICD-10-CM | POA: Diagnosis not present

## 2015-01-22 DIAGNOSIS — F411 Generalized anxiety disorder: Secondary | ICD-10-CM | POA: Diagnosis not present

## 2015-01-22 DIAGNOSIS — R3915 Urgency of urination: Secondary | ICD-10-CM | POA: Diagnosis not present

## 2015-02-02 DIAGNOSIS — K219 Gastro-esophageal reflux disease without esophagitis: Secondary | ICD-10-CM | POA: Diagnosis not present

## 2015-02-02 DIAGNOSIS — K5909 Other constipation: Secondary | ICD-10-CM | POA: Diagnosis not present

## 2015-02-02 DIAGNOSIS — M810 Age-related osteoporosis without current pathological fracture: Secondary | ICD-10-CM | POA: Diagnosis not present

## 2015-02-02 DIAGNOSIS — Z01818 Encounter for other preprocedural examination: Secondary | ICD-10-CM | POA: Diagnosis not present

## 2015-02-02 DIAGNOSIS — F329 Major depressive disorder, single episode, unspecified: Secondary | ICD-10-CM | POA: Diagnosis not present

## 2015-02-02 DIAGNOSIS — M419 Scoliosis, unspecified: Secondary | ICD-10-CM | POA: Diagnosis not present

## 2015-02-02 DIAGNOSIS — I10 Essential (primary) hypertension: Secondary | ICD-10-CM | POA: Diagnosis not present

## 2015-02-02 DIAGNOSIS — Z79899 Other long term (current) drug therapy: Secondary | ICD-10-CM | POA: Diagnosis not present

## 2015-02-02 DIAGNOSIS — M545 Low back pain: Secondary | ICD-10-CM | POA: Diagnosis not present

## 2015-02-02 DIAGNOSIS — M4030 Flatback syndrome, site unspecified: Secondary | ICD-10-CM | POA: Diagnosis not present

## 2015-02-02 DIAGNOSIS — M4126 Other idiopathic scoliosis, lumbar region: Secondary | ICD-10-CM | POA: Diagnosis not present

## 2015-02-02 DIAGNOSIS — F410 Panic disorder [episodic paroxysmal anxiety] without agoraphobia: Secondary | ICD-10-CM | POA: Diagnosis not present

## 2015-02-02 DIAGNOSIS — I952 Hypotension due to drugs: Secondary | ICD-10-CM | POA: Diagnosis not present

## 2015-02-13 DIAGNOSIS — I999 Unspecified disorder of circulatory system: Secondary | ICD-10-CM | POA: Diagnosis not present

## 2015-02-13 DIAGNOSIS — I959 Hypotension, unspecified: Secondary | ICD-10-CM | POA: Diagnosis not present

## 2015-02-13 DIAGNOSIS — M129 Arthropathy, unspecified: Secondary | ICD-10-CM | POA: Diagnosis not present

## 2015-02-13 DIAGNOSIS — M62830 Muscle spasm of back: Secondary | ICD-10-CM | POA: Diagnosis not present

## 2015-02-13 DIAGNOSIS — S20121A Blister (nonthermal) of breast, right breast, initial encounter: Secondary | ICD-10-CM | POA: Diagnosis not present

## 2015-02-13 DIAGNOSIS — K913 Postprocedural intestinal obstruction: Secondary | ICD-10-CM | POA: Diagnosis not present

## 2015-02-13 DIAGNOSIS — R410 Disorientation, unspecified: Secondary | ICD-10-CM | POA: Diagnosis not present

## 2015-02-13 DIAGNOSIS — R338 Other retention of urine: Secondary | ICD-10-CM | POA: Diagnosis not present

## 2015-02-13 DIAGNOSIS — F329 Major depressive disorder, single episode, unspecified: Secondary | ICD-10-CM | POA: Diagnosis not present

## 2015-02-13 DIAGNOSIS — M4120 Other idiopathic scoliosis, site unspecified: Secondary | ICD-10-CM | POA: Diagnosis not present

## 2015-02-13 DIAGNOSIS — F419 Anxiety disorder, unspecified: Secondary | ICD-10-CM | POA: Diagnosis not present

## 2015-02-13 DIAGNOSIS — F05 Delirium due to known physiological condition: Secondary | ICD-10-CM | POA: Diagnosis not present

## 2015-02-13 DIAGNOSIS — G8918 Other acute postprocedural pain: Secondary | ICD-10-CM | POA: Diagnosis not present

## 2015-02-13 DIAGNOSIS — G894 Chronic pain syndrome: Secondary | ICD-10-CM | POA: Diagnosis not present

## 2015-02-13 DIAGNOSIS — R278 Other lack of coordination: Secondary | ICD-10-CM | POA: Diagnosis not present

## 2015-02-13 DIAGNOSIS — E785 Hyperlipidemia, unspecified: Secondary | ICD-10-CM | POA: Diagnosis not present

## 2015-02-13 DIAGNOSIS — M4186 Other forms of scoliosis, lumbar region: Secondary | ICD-10-CM | POA: Diagnosis not present

## 2015-02-13 DIAGNOSIS — R140 Abdominal distension (gaseous): Secondary | ICD-10-CM | POA: Diagnosis not present

## 2015-02-13 DIAGNOSIS — S20122A Blister (nonthermal) of breast, left breast, initial encounter: Secondary | ICD-10-CM | POA: Diagnosis not present

## 2015-02-13 DIAGNOSIS — M4325 Fusion of spine, thoracolumbar region: Secondary | ICD-10-CM | POA: Diagnosis not present

## 2015-02-13 DIAGNOSIS — K219 Gastro-esophageal reflux disease without esophagitis: Secondary | ICD-10-CM | POA: Diagnosis not present

## 2015-02-13 DIAGNOSIS — G8929 Other chronic pain: Secondary | ICD-10-CM | POA: Diagnosis not present

## 2015-02-13 DIAGNOSIS — M549 Dorsalgia, unspecified: Secondary | ICD-10-CM | POA: Diagnosis not present

## 2015-02-13 DIAGNOSIS — M4125 Other idiopathic scoliosis, thoracolumbar region: Secondary | ICD-10-CM | POA: Diagnosis not present

## 2015-02-13 DIAGNOSIS — I10 Essential (primary) hypertension: Secondary | ICD-10-CM | POA: Diagnosis not present

## 2015-02-13 DIAGNOSIS — Z981 Arthrodesis status: Secondary | ICD-10-CM | POA: Diagnosis not present

## 2015-02-13 DIAGNOSIS — N390 Urinary tract infection, site not specified: Secondary | ICD-10-CM | POA: Diagnosis not present

## 2015-02-13 DIAGNOSIS — D62 Acute posthemorrhagic anemia: Secondary | ICD-10-CM | POA: Diagnosis not present

## 2015-02-13 DIAGNOSIS — F328 Other depressive episodes: Secondary | ICD-10-CM | POA: Diagnosis not present

## 2015-02-13 DIAGNOSIS — M4030 Flatback syndrome, site unspecified: Secondary | ICD-10-CM | POA: Diagnosis not present

## 2015-02-13 DIAGNOSIS — F418 Other specified anxiety disorders: Secondary | ICD-10-CM | POA: Diagnosis not present

## 2015-02-13 DIAGNOSIS — K5909 Other constipation: Secondary | ICD-10-CM | POA: Diagnosis not present

## 2015-02-13 DIAGNOSIS — Z87891 Personal history of nicotine dependence: Secondary | ICD-10-CM | POA: Diagnosis not present

## 2015-02-13 DIAGNOSIS — F339 Major depressive disorder, recurrent, unspecified: Secondary | ICD-10-CM | POA: Diagnosis not present

## 2015-02-13 DIAGNOSIS — M419 Scoliosis, unspecified: Secondary | ICD-10-CM | POA: Diagnosis not present

## 2015-02-13 DIAGNOSIS — K5900 Constipation, unspecified: Secondary | ICD-10-CM | POA: Diagnosis not present

## 2015-02-20 DIAGNOSIS — I10 Essential (primary) hypertension: Secondary | ICD-10-CM | POA: Diagnosis not present

## 2015-02-20 DIAGNOSIS — M129 Arthropathy, unspecified: Secondary | ICD-10-CM | POA: Diagnosis not present

## 2015-02-20 DIAGNOSIS — R278 Other lack of coordination: Secondary | ICD-10-CM | POA: Diagnosis not present

## 2015-02-20 DIAGNOSIS — E785 Hyperlipidemia, unspecified: Secondary | ICD-10-CM | POA: Diagnosis not present

## 2015-02-20 DIAGNOSIS — M419 Scoliosis, unspecified: Secondary | ICD-10-CM | POA: Diagnosis not present

## 2015-02-20 DIAGNOSIS — R140 Abdominal distension (gaseous): Secondary | ICD-10-CM | POA: Diagnosis not present

## 2015-02-20 DIAGNOSIS — F339 Major depressive disorder, recurrent, unspecified: Secondary | ICD-10-CM | POA: Diagnosis not present

## 2015-02-20 DIAGNOSIS — F329 Major depressive disorder, single episode, unspecified: Secondary | ICD-10-CM | POA: Diagnosis not present

## 2015-02-20 DIAGNOSIS — F419 Anxiety disorder, unspecified: Secondary | ICD-10-CM | POA: Diagnosis not present

## 2015-02-20 DIAGNOSIS — G894 Chronic pain syndrome: Secondary | ICD-10-CM | POA: Diagnosis not present

## 2015-02-20 DIAGNOSIS — Q7649 Other congenital malformations of spine, not associated with scoliosis: Secondary | ICD-10-CM | POA: Diagnosis not present

## 2015-02-20 DIAGNOSIS — I110 Hypertensive heart disease with heart failure: Secondary | ICD-10-CM | POA: Diagnosis not present

## 2015-02-20 DIAGNOSIS — M4186 Other forms of scoliosis, lumbar region: Secondary | ICD-10-CM | POA: Diagnosis not present

## 2015-02-20 DIAGNOSIS — R52 Pain, unspecified: Secondary | ICD-10-CM | POA: Diagnosis not present

## 2015-02-20 DIAGNOSIS — N390 Urinary tract infection, site not specified: Secondary | ICD-10-CM | POA: Diagnosis not present

## 2015-02-20 DIAGNOSIS — G4700 Insomnia, unspecified: Secondary | ICD-10-CM | POA: Diagnosis not present

## 2015-02-20 DIAGNOSIS — R600 Localized edema: Secondary | ICD-10-CM | POA: Diagnosis not present

## 2015-02-20 DIAGNOSIS — F410 Panic disorder [episodic paroxysmal anxiety] without agoraphobia: Secondary | ICD-10-CM | POA: Diagnosis not present

## 2015-02-20 DIAGNOSIS — D649 Anemia, unspecified: Secondary | ICD-10-CM | POA: Diagnosis not present

## 2015-02-20 DIAGNOSIS — K5900 Constipation, unspecified: Secondary | ICD-10-CM | POA: Diagnosis not present

## 2015-02-20 DIAGNOSIS — I5030 Unspecified diastolic (congestive) heart failure: Secondary | ICD-10-CM | POA: Diagnosis not present

## 2015-02-21 DIAGNOSIS — N390 Urinary tract infection, site not specified: Secondary | ICD-10-CM | POA: Diagnosis not present

## 2015-02-21 DIAGNOSIS — D649 Anemia, unspecified: Secondary | ICD-10-CM | POA: Diagnosis not present

## 2015-02-21 DIAGNOSIS — F410 Panic disorder [episodic paroxysmal anxiety] without agoraphobia: Secondary | ICD-10-CM | POA: Diagnosis not present

## 2015-02-21 DIAGNOSIS — M419 Scoliosis, unspecified: Secondary | ICD-10-CM | POA: Diagnosis not present

## 2015-02-22 DIAGNOSIS — R600 Localized edema: Secondary | ICD-10-CM | POA: Diagnosis not present

## 2015-02-22 DIAGNOSIS — I5030 Unspecified diastolic (congestive) heart failure: Secondary | ICD-10-CM | POA: Diagnosis not present

## 2015-02-22 DIAGNOSIS — I110 Hypertensive heart disease with heart failure: Secondary | ICD-10-CM | POA: Diagnosis not present

## 2015-02-22 DIAGNOSIS — R52 Pain, unspecified: Secondary | ICD-10-CM | POA: Diagnosis not present

## 2015-02-23 DIAGNOSIS — F419 Anxiety disorder, unspecified: Secondary | ICD-10-CM | POA: Diagnosis not present

## 2015-03-01 DIAGNOSIS — G894 Chronic pain syndrome: Secondary | ICD-10-CM | POA: Diagnosis not present

## 2015-03-01 DIAGNOSIS — I110 Hypertensive heart disease with heart failure: Secondary | ICD-10-CM | POA: Diagnosis not present

## 2015-03-01 DIAGNOSIS — R52 Pain, unspecified: Secondary | ICD-10-CM | POA: Diagnosis not present

## 2015-03-01 DIAGNOSIS — I5030 Unspecified diastolic (congestive) heart failure: Secondary | ICD-10-CM | POA: Diagnosis not present

## 2015-03-01 DIAGNOSIS — R600 Localized edema: Secondary | ICD-10-CM | POA: Diagnosis not present

## 2015-03-01 DIAGNOSIS — F419 Anxiety disorder, unspecified: Secondary | ICD-10-CM | POA: Diagnosis not present

## 2015-03-01 DIAGNOSIS — F329 Major depressive disorder, single episode, unspecified: Secondary | ICD-10-CM | POA: Diagnosis not present

## 2015-03-01 DIAGNOSIS — G4700 Insomnia, unspecified: Secondary | ICD-10-CM | POA: Diagnosis not present

## 2015-03-05 DIAGNOSIS — F419 Anxiety disorder, unspecified: Secondary | ICD-10-CM | POA: Diagnosis not present

## 2015-03-06 DIAGNOSIS — F329 Major depressive disorder, single episode, unspecified: Secondary | ICD-10-CM | POA: Diagnosis not present

## 2015-03-06 DIAGNOSIS — F419 Anxiety disorder, unspecified: Secondary | ICD-10-CM | POA: Diagnosis not present

## 2015-03-06 DIAGNOSIS — G4700 Insomnia, unspecified: Secondary | ICD-10-CM | POA: Diagnosis not present

## 2015-03-06 DIAGNOSIS — G894 Chronic pain syndrome: Secondary | ICD-10-CM | POA: Diagnosis not present

## 2015-03-08 DIAGNOSIS — I110 Hypertensive heart disease with heart failure: Secondary | ICD-10-CM | POA: Diagnosis not present

## 2015-03-08 DIAGNOSIS — E785 Hyperlipidemia, unspecified: Secondary | ICD-10-CM | POA: Diagnosis not present

## 2015-03-08 DIAGNOSIS — R52 Pain, unspecified: Secondary | ICD-10-CM | POA: Diagnosis not present

## 2015-03-08 DIAGNOSIS — I5030 Unspecified diastolic (congestive) heart failure: Secondary | ICD-10-CM | POA: Diagnosis not present

## 2015-03-08 DIAGNOSIS — R600 Localized edema: Secondary | ICD-10-CM | POA: Diagnosis not present

## 2015-03-12 DIAGNOSIS — Z981 Arthrodesis status: Secondary | ICD-10-CM | POA: Diagnosis not present

## 2015-03-12 DIAGNOSIS — M4120 Other idiopathic scoliosis, site unspecified: Secondary | ICD-10-CM | POA: Diagnosis not present

## 2015-03-15 DIAGNOSIS — Z9181 History of falling: Secondary | ICD-10-CM | POA: Diagnosis not present

## 2015-03-15 DIAGNOSIS — Z1389 Encounter for screening for other disorder: Secondary | ICD-10-CM | POA: Diagnosis not present

## 2015-03-15 DIAGNOSIS — G4700 Insomnia, unspecified: Secondary | ICD-10-CM | POA: Diagnosis not present

## 2015-03-15 DIAGNOSIS — M545 Low back pain: Secondary | ICD-10-CM | POA: Diagnosis not present

## 2015-03-15 DIAGNOSIS — F411 Generalized anxiety disorder: Secondary | ICD-10-CM | POA: Diagnosis not present

## 2015-03-15 DIAGNOSIS — I10 Essential (primary) hypertension: Secondary | ICD-10-CM | POA: Diagnosis not present

## 2015-03-16 DIAGNOSIS — I10 Essential (primary) hypertension: Secondary | ICD-10-CM | POA: Diagnosis not present

## 2015-03-16 DIAGNOSIS — Z4789 Encounter for other orthopedic aftercare: Secondary | ICD-10-CM | POA: Diagnosis not present

## 2015-03-16 DIAGNOSIS — M810 Age-related osteoporosis without current pathological fracture: Secondary | ICD-10-CM | POA: Diagnosis not present

## 2015-03-16 DIAGNOSIS — M4120 Other idiopathic scoliosis, site unspecified: Secondary | ICD-10-CM | POA: Diagnosis not present

## 2015-03-16 DIAGNOSIS — G894 Chronic pain syndrome: Secondary | ICD-10-CM | POA: Diagnosis not present

## 2015-03-16 DIAGNOSIS — R262 Difficulty in walking, not elsewhere classified: Secondary | ICD-10-CM | POA: Diagnosis not present

## 2015-03-21 DIAGNOSIS — R262 Difficulty in walking, not elsewhere classified: Secondary | ICD-10-CM | POA: Diagnosis not present

## 2015-03-21 DIAGNOSIS — Z4789 Encounter for other orthopedic aftercare: Secondary | ICD-10-CM | POA: Diagnosis not present

## 2015-03-21 DIAGNOSIS — G894 Chronic pain syndrome: Secondary | ICD-10-CM | POA: Diagnosis not present

## 2015-03-21 DIAGNOSIS — I10 Essential (primary) hypertension: Secondary | ICD-10-CM | POA: Diagnosis not present

## 2015-03-21 DIAGNOSIS — M4120 Other idiopathic scoliosis, site unspecified: Secondary | ICD-10-CM | POA: Diagnosis not present

## 2015-03-21 DIAGNOSIS — M810 Age-related osteoporosis without current pathological fracture: Secondary | ICD-10-CM | POA: Diagnosis not present

## 2015-03-22 DIAGNOSIS — M810 Age-related osteoporosis without current pathological fracture: Secondary | ICD-10-CM | POA: Diagnosis not present

## 2015-03-22 DIAGNOSIS — R5381 Other malaise: Secondary | ICD-10-CM | POA: Diagnosis not present

## 2015-03-22 DIAGNOSIS — F411 Generalized anxiety disorder: Secondary | ICD-10-CM | POA: Diagnosis not present

## 2015-03-22 DIAGNOSIS — R262 Difficulty in walking, not elsewhere classified: Secondary | ICD-10-CM | POA: Diagnosis not present

## 2015-03-22 DIAGNOSIS — Z6827 Body mass index (BMI) 27.0-27.9, adult: Secondary | ICD-10-CM | POA: Diagnosis not present

## 2015-03-22 DIAGNOSIS — M4120 Other idiopathic scoliosis, site unspecified: Secondary | ICD-10-CM | POA: Diagnosis not present

## 2015-03-22 DIAGNOSIS — Z139 Encounter for screening, unspecified: Secondary | ICD-10-CM | POA: Diagnosis not present

## 2015-03-22 DIAGNOSIS — Z4789 Encounter for other orthopedic aftercare: Secondary | ICD-10-CM | POA: Diagnosis not present

## 2015-03-22 DIAGNOSIS — I10 Essential (primary) hypertension: Secondary | ICD-10-CM | POA: Diagnosis not present

## 2015-03-22 DIAGNOSIS — G894 Chronic pain syndrome: Secondary | ICD-10-CM | POA: Diagnosis not present

## 2015-03-23 DIAGNOSIS — G894 Chronic pain syndrome: Secondary | ICD-10-CM | POA: Diagnosis not present

## 2015-03-23 DIAGNOSIS — R262 Difficulty in walking, not elsewhere classified: Secondary | ICD-10-CM | POA: Diagnosis not present

## 2015-03-23 DIAGNOSIS — M810 Age-related osteoporosis without current pathological fracture: Secondary | ICD-10-CM | POA: Diagnosis not present

## 2015-03-23 DIAGNOSIS — I10 Essential (primary) hypertension: Secondary | ICD-10-CM | POA: Diagnosis not present

## 2015-03-23 DIAGNOSIS — M4120 Other idiopathic scoliosis, site unspecified: Secondary | ICD-10-CM | POA: Diagnosis not present

## 2015-03-23 DIAGNOSIS — Z4789 Encounter for other orthopedic aftercare: Secondary | ICD-10-CM | POA: Diagnosis not present

## 2015-03-26 DIAGNOSIS — M549 Dorsalgia, unspecified: Secondary | ICD-10-CM | POA: Diagnosis not present

## 2015-03-27 DIAGNOSIS — R531 Weakness: Secondary | ICD-10-CM | POA: Diagnosis not present

## 2015-03-27 DIAGNOSIS — F411 Generalized anxiety disorder: Secondary | ICD-10-CM | POA: Diagnosis not present

## 2015-03-27 DIAGNOSIS — M545 Low back pain: Secondary | ICD-10-CM | POA: Diagnosis not present

## 2015-03-27 DIAGNOSIS — Z139 Encounter for screening, unspecified: Secondary | ICD-10-CM | POA: Diagnosis not present

## 2015-03-27 DIAGNOSIS — L299 Pruritus, unspecified: Secondary | ICD-10-CM | POA: Diagnosis not present

## 2015-03-28 DIAGNOSIS — G894 Chronic pain syndrome: Secondary | ICD-10-CM | POA: Diagnosis not present

## 2015-03-28 DIAGNOSIS — I10 Essential (primary) hypertension: Secondary | ICD-10-CM | POA: Diagnosis not present

## 2015-03-28 DIAGNOSIS — R262 Difficulty in walking, not elsewhere classified: Secondary | ICD-10-CM | POA: Diagnosis not present

## 2015-03-28 DIAGNOSIS — M810 Age-related osteoporosis without current pathological fracture: Secondary | ICD-10-CM | POA: Diagnosis not present

## 2015-03-28 DIAGNOSIS — Z4789 Encounter for other orthopedic aftercare: Secondary | ICD-10-CM | POA: Diagnosis not present

## 2015-03-28 DIAGNOSIS — M4120 Other idiopathic scoliosis, site unspecified: Secondary | ICD-10-CM | POA: Diagnosis not present

## 2015-03-29 DIAGNOSIS — G894 Chronic pain syndrome: Secondary | ICD-10-CM | POA: Diagnosis not present

## 2015-03-29 DIAGNOSIS — M810 Age-related osteoporosis without current pathological fracture: Secondary | ICD-10-CM | POA: Diagnosis not present

## 2015-03-29 DIAGNOSIS — Z4789 Encounter for other orthopedic aftercare: Secondary | ICD-10-CM | POA: Diagnosis not present

## 2015-03-29 DIAGNOSIS — M4120 Other idiopathic scoliosis, site unspecified: Secondary | ICD-10-CM | POA: Diagnosis not present

## 2015-03-29 DIAGNOSIS — R262 Difficulty in walking, not elsewhere classified: Secondary | ICD-10-CM | POA: Diagnosis not present

## 2015-03-29 DIAGNOSIS — I10 Essential (primary) hypertension: Secondary | ICD-10-CM | POA: Diagnosis not present

## 2015-03-30 DIAGNOSIS — R262 Difficulty in walking, not elsewhere classified: Secondary | ICD-10-CM | POA: Diagnosis not present

## 2015-03-30 DIAGNOSIS — Z4789 Encounter for other orthopedic aftercare: Secondary | ICD-10-CM | POA: Diagnosis not present

## 2015-03-30 DIAGNOSIS — G894 Chronic pain syndrome: Secondary | ICD-10-CM | POA: Diagnosis not present

## 2015-03-30 DIAGNOSIS — I10 Essential (primary) hypertension: Secondary | ICD-10-CM | POA: Diagnosis not present

## 2015-03-30 DIAGNOSIS — M4120 Other idiopathic scoliosis, site unspecified: Secondary | ICD-10-CM | POA: Diagnosis not present

## 2015-03-30 DIAGNOSIS — M810 Age-related osteoporosis without current pathological fracture: Secondary | ICD-10-CM | POA: Diagnosis not present

## 2015-04-02 DIAGNOSIS — I10 Essential (primary) hypertension: Secondary | ICD-10-CM | POA: Diagnosis not present

## 2015-04-02 DIAGNOSIS — M810 Age-related osteoporosis without current pathological fracture: Secondary | ICD-10-CM | POA: Diagnosis not present

## 2015-04-02 DIAGNOSIS — M4120 Other idiopathic scoliosis, site unspecified: Secondary | ICD-10-CM | POA: Diagnosis not present

## 2015-04-02 DIAGNOSIS — Z4789 Encounter for other orthopedic aftercare: Secondary | ICD-10-CM | POA: Diagnosis not present

## 2015-04-02 DIAGNOSIS — R262 Difficulty in walking, not elsewhere classified: Secondary | ICD-10-CM | POA: Diagnosis not present

## 2015-04-02 DIAGNOSIS — G894 Chronic pain syndrome: Secondary | ICD-10-CM | POA: Diagnosis not present

## 2015-04-03 DIAGNOSIS — I10 Essential (primary) hypertension: Secondary | ICD-10-CM | POA: Diagnosis not present

## 2015-04-03 DIAGNOSIS — R262 Difficulty in walking, not elsewhere classified: Secondary | ICD-10-CM | POA: Diagnosis not present

## 2015-04-03 DIAGNOSIS — G894 Chronic pain syndrome: Secondary | ICD-10-CM | POA: Diagnosis not present

## 2015-04-03 DIAGNOSIS — M810 Age-related osteoporosis without current pathological fracture: Secondary | ICD-10-CM | POA: Diagnosis not present

## 2015-04-03 DIAGNOSIS — Z4789 Encounter for other orthopedic aftercare: Secondary | ICD-10-CM | POA: Diagnosis not present

## 2015-04-03 DIAGNOSIS — M4120 Other idiopathic scoliosis, site unspecified: Secondary | ICD-10-CM | POA: Diagnosis not present

## 2015-04-05 DIAGNOSIS — I10 Essential (primary) hypertension: Secondary | ICD-10-CM | POA: Diagnosis not present

## 2015-04-05 DIAGNOSIS — Z4789 Encounter for other orthopedic aftercare: Secondary | ICD-10-CM | POA: Diagnosis not present

## 2015-04-05 DIAGNOSIS — M4120 Other idiopathic scoliosis, site unspecified: Secondary | ICD-10-CM | POA: Diagnosis not present

## 2015-04-05 DIAGNOSIS — G894 Chronic pain syndrome: Secondary | ICD-10-CM | POA: Diagnosis not present

## 2015-04-05 DIAGNOSIS — R262 Difficulty in walking, not elsewhere classified: Secondary | ICD-10-CM | POA: Diagnosis not present

## 2015-04-05 DIAGNOSIS — M810 Age-related osteoporosis without current pathological fracture: Secondary | ICD-10-CM | POA: Diagnosis not present

## 2015-04-06 DIAGNOSIS — R600 Localized edema: Secondary | ICD-10-CM | POA: Diagnosis not present

## 2015-04-06 DIAGNOSIS — I10 Essential (primary) hypertension: Secondary | ICD-10-CM | POA: Diagnosis not present

## 2015-04-06 DIAGNOSIS — L299 Pruritus, unspecified: Secondary | ICD-10-CM | POA: Diagnosis not present

## 2015-04-06 DIAGNOSIS — F411 Generalized anxiety disorder: Secondary | ICD-10-CM | POA: Diagnosis not present

## 2015-04-06 DIAGNOSIS — N390 Urinary tract infection, site not specified: Secondary | ICD-10-CM | POA: Diagnosis not present

## 2015-04-10 DIAGNOSIS — M4120 Other idiopathic scoliosis, site unspecified: Secondary | ICD-10-CM | POA: Diagnosis not present

## 2015-04-10 DIAGNOSIS — R262 Difficulty in walking, not elsewhere classified: Secondary | ICD-10-CM | POA: Diagnosis not present

## 2015-04-10 DIAGNOSIS — M810 Age-related osteoporosis without current pathological fracture: Secondary | ICD-10-CM | POA: Diagnosis not present

## 2015-04-10 DIAGNOSIS — Z4789 Encounter for other orthopedic aftercare: Secondary | ICD-10-CM | POA: Diagnosis not present

## 2015-04-10 DIAGNOSIS — I10 Essential (primary) hypertension: Secondary | ICD-10-CM | POA: Diagnosis not present

## 2015-04-10 DIAGNOSIS — G894 Chronic pain syndrome: Secondary | ICD-10-CM | POA: Diagnosis not present

## 2015-04-12 DIAGNOSIS — G894 Chronic pain syndrome: Secondary | ICD-10-CM | POA: Diagnosis not present

## 2015-04-12 DIAGNOSIS — M545 Low back pain: Secondary | ICD-10-CM | POA: Diagnosis not present

## 2015-04-12 DIAGNOSIS — Z4789 Encounter for other orthopedic aftercare: Secondary | ICD-10-CM | POA: Diagnosis not present

## 2015-04-12 DIAGNOSIS — R300 Dysuria: Secondary | ICD-10-CM | POA: Diagnosis not present

## 2015-04-12 DIAGNOSIS — M4120 Other idiopathic scoliosis, site unspecified: Secondary | ICD-10-CM | POA: Diagnosis not present

## 2015-04-12 DIAGNOSIS — R262 Difficulty in walking, not elsewhere classified: Secondary | ICD-10-CM | POA: Diagnosis not present

## 2015-04-12 DIAGNOSIS — M810 Age-related osteoporosis without current pathological fracture: Secondary | ICD-10-CM | POA: Diagnosis not present

## 2015-04-12 DIAGNOSIS — Z6826 Body mass index (BMI) 26.0-26.9, adult: Secondary | ICD-10-CM | POA: Diagnosis not present

## 2015-04-12 DIAGNOSIS — I10 Essential (primary) hypertension: Secondary | ICD-10-CM | POA: Diagnosis not present

## 2015-04-16 DIAGNOSIS — G894 Chronic pain syndrome: Secondary | ICD-10-CM | POA: Diagnosis not present

## 2015-04-16 DIAGNOSIS — I10 Essential (primary) hypertension: Secondary | ICD-10-CM | POA: Diagnosis not present

## 2015-04-16 DIAGNOSIS — M4120 Other idiopathic scoliosis, site unspecified: Secondary | ICD-10-CM | POA: Diagnosis not present

## 2015-04-16 DIAGNOSIS — Z4789 Encounter for other orthopedic aftercare: Secondary | ICD-10-CM | POA: Diagnosis not present

## 2015-04-16 DIAGNOSIS — R262 Difficulty in walking, not elsewhere classified: Secondary | ICD-10-CM | POA: Diagnosis not present

## 2015-04-16 DIAGNOSIS — M810 Age-related osteoporosis without current pathological fracture: Secondary | ICD-10-CM | POA: Diagnosis not present

## 2015-04-17 DIAGNOSIS — R262 Difficulty in walking, not elsewhere classified: Secondary | ICD-10-CM | POA: Diagnosis not present

## 2015-04-17 DIAGNOSIS — G894 Chronic pain syndrome: Secondary | ICD-10-CM | POA: Diagnosis not present

## 2015-04-17 DIAGNOSIS — Z4789 Encounter for other orthopedic aftercare: Secondary | ICD-10-CM | POA: Diagnosis not present

## 2015-04-17 DIAGNOSIS — M4120 Other idiopathic scoliosis, site unspecified: Secondary | ICD-10-CM | POA: Diagnosis not present

## 2015-04-17 DIAGNOSIS — I10 Essential (primary) hypertension: Secondary | ICD-10-CM | POA: Diagnosis not present

## 2015-04-17 DIAGNOSIS — M810 Age-related osteoporosis without current pathological fracture: Secondary | ICD-10-CM | POA: Diagnosis not present

## 2015-04-19 DIAGNOSIS — M4120 Other idiopathic scoliosis, site unspecified: Secondary | ICD-10-CM | POA: Diagnosis not present

## 2015-04-19 DIAGNOSIS — Z4789 Encounter for other orthopedic aftercare: Secondary | ICD-10-CM | POA: Diagnosis not present

## 2015-04-19 DIAGNOSIS — G894 Chronic pain syndrome: Secondary | ICD-10-CM | POA: Diagnosis not present

## 2015-04-19 DIAGNOSIS — R262 Difficulty in walking, not elsewhere classified: Secondary | ICD-10-CM | POA: Diagnosis not present

## 2015-04-19 DIAGNOSIS — I10 Essential (primary) hypertension: Secondary | ICD-10-CM | POA: Diagnosis not present

## 2015-04-19 DIAGNOSIS — M810 Age-related osteoporosis without current pathological fracture: Secondary | ICD-10-CM | POA: Diagnosis not present

## 2015-04-24 DIAGNOSIS — G894 Chronic pain syndrome: Secondary | ICD-10-CM | POA: Diagnosis not present

## 2015-04-24 DIAGNOSIS — M4120 Other idiopathic scoliosis, site unspecified: Secondary | ICD-10-CM | POA: Diagnosis not present

## 2015-04-24 DIAGNOSIS — R262 Difficulty in walking, not elsewhere classified: Secondary | ICD-10-CM | POA: Diagnosis not present

## 2015-04-24 DIAGNOSIS — M810 Age-related osteoporosis without current pathological fracture: Secondary | ICD-10-CM | POA: Diagnosis not present

## 2015-04-24 DIAGNOSIS — I10 Essential (primary) hypertension: Secondary | ICD-10-CM | POA: Diagnosis not present

## 2015-04-24 DIAGNOSIS — Z4789 Encounter for other orthopedic aftercare: Secondary | ICD-10-CM | POA: Diagnosis not present

## 2015-04-30 DIAGNOSIS — M4120 Other idiopathic scoliosis, site unspecified: Secondary | ICD-10-CM | POA: Diagnosis not present

## 2015-04-30 DIAGNOSIS — R262 Difficulty in walking, not elsewhere classified: Secondary | ICD-10-CM | POA: Diagnosis not present

## 2015-04-30 DIAGNOSIS — G894 Chronic pain syndrome: Secondary | ICD-10-CM | POA: Diagnosis not present

## 2015-04-30 DIAGNOSIS — M810 Age-related osteoporosis without current pathological fracture: Secondary | ICD-10-CM | POA: Diagnosis not present

## 2015-04-30 DIAGNOSIS — I10 Essential (primary) hypertension: Secondary | ICD-10-CM | POA: Diagnosis not present

## 2015-04-30 DIAGNOSIS — Z4789 Encounter for other orthopedic aftercare: Secondary | ICD-10-CM | POA: Diagnosis not present

## 2015-05-01 DIAGNOSIS — R300 Dysuria: Secondary | ICD-10-CM | POA: Diagnosis not present

## 2015-05-01 DIAGNOSIS — N390 Urinary tract infection, site not specified: Secondary | ICD-10-CM | POA: Diagnosis not present

## 2015-05-01 DIAGNOSIS — N3090 Cystitis, unspecified without hematuria: Secondary | ICD-10-CM | POA: Diagnosis not present

## 2015-05-01 DIAGNOSIS — R3915 Urgency of urination: Secondary | ICD-10-CM | POA: Diagnosis not present

## 2015-05-08 DIAGNOSIS — Z6826 Body mass index (BMI) 26.0-26.9, adult: Secondary | ICD-10-CM | POA: Diagnosis not present

## 2015-05-08 DIAGNOSIS — M545 Low back pain: Secondary | ICD-10-CM | POA: Diagnosis not present

## 2015-05-08 DIAGNOSIS — F411 Generalized anxiety disorder: Secondary | ICD-10-CM | POA: Diagnosis not present

## 2015-05-09 DIAGNOSIS — R262 Difficulty in walking, not elsewhere classified: Secondary | ICD-10-CM | POA: Diagnosis not present

## 2015-05-09 DIAGNOSIS — Z4789 Encounter for other orthopedic aftercare: Secondary | ICD-10-CM | POA: Diagnosis not present

## 2015-05-09 DIAGNOSIS — M4120 Other idiopathic scoliosis, site unspecified: Secondary | ICD-10-CM | POA: Diagnosis not present

## 2015-05-09 DIAGNOSIS — G894 Chronic pain syndrome: Secondary | ICD-10-CM | POA: Diagnosis not present

## 2015-05-09 DIAGNOSIS — I10 Essential (primary) hypertension: Secondary | ICD-10-CM | POA: Diagnosis not present

## 2015-05-09 DIAGNOSIS — M810 Age-related osteoporosis without current pathological fracture: Secondary | ICD-10-CM | POA: Diagnosis not present

## 2015-05-10 DIAGNOSIS — M419 Scoliosis, unspecified: Secondary | ICD-10-CM | POA: Diagnosis not present

## 2015-05-10 DIAGNOSIS — M549 Dorsalgia, unspecified: Secondary | ICD-10-CM | POA: Diagnosis not present

## 2015-05-10 DIAGNOSIS — M4106 Infantile idiopathic scoliosis, lumbar region: Secondary | ICD-10-CM | POA: Diagnosis not present

## 2015-05-10 DIAGNOSIS — Z981 Arthrodesis status: Secondary | ICD-10-CM | POA: Diagnosis not present

## 2015-05-10 DIAGNOSIS — M4325 Fusion of spine, thoracolumbar region: Secondary | ICD-10-CM | POA: Diagnosis not present

## 2015-05-14 DIAGNOSIS — R300 Dysuria: Secondary | ICD-10-CM | POA: Diagnosis not present

## 2015-05-14 DIAGNOSIS — N3090 Cystitis, unspecified without hematuria: Secondary | ICD-10-CM | POA: Diagnosis not present

## 2015-05-14 DIAGNOSIS — N318 Other neuromuscular dysfunction of bladder: Secondary | ICD-10-CM | POA: Diagnosis not present

## 2015-05-14 DIAGNOSIS — R3915 Urgency of urination: Secondary | ICD-10-CM | POA: Diagnosis not present

## 2015-05-17 DIAGNOSIS — H26493 Other secondary cataract, bilateral: Secondary | ICD-10-CM | POA: Diagnosis not present

## 2015-05-17 DIAGNOSIS — H524 Presbyopia: Secondary | ICD-10-CM | POA: Diagnosis not present

## 2015-05-17 DIAGNOSIS — H43813 Vitreous degeneration, bilateral: Secondary | ICD-10-CM | POA: Diagnosis not present

## 2015-05-31 DIAGNOSIS — M47812 Spondylosis without myelopathy or radiculopathy, cervical region: Secondary | ICD-10-CM | POA: Diagnosis not present

## 2015-05-31 DIAGNOSIS — M546 Pain in thoracic spine: Secondary | ICD-10-CM | POA: Diagnosis not present

## 2015-05-31 DIAGNOSIS — M47816 Spondylosis without myelopathy or radiculopathy, lumbar region: Secondary | ICD-10-CM | POA: Diagnosis not present

## 2015-05-31 DIAGNOSIS — Z981 Arthrodesis status: Secondary | ICD-10-CM | POA: Diagnosis not present

## 2015-06-08 DIAGNOSIS — Z6827 Body mass index (BMI) 27.0-27.9, adult: Secondary | ICD-10-CM | POA: Diagnosis not present

## 2015-06-08 DIAGNOSIS — M545 Low back pain: Secondary | ICD-10-CM | POA: Diagnosis not present

## 2015-06-13 DIAGNOSIS — F411 Generalized anxiety disorder: Secondary | ICD-10-CM | POA: Diagnosis not present

## 2015-06-13 DIAGNOSIS — M5384 Other specified dorsopathies, thoracic region: Secondary | ICD-10-CM | POA: Diagnosis not present

## 2015-06-13 DIAGNOSIS — I10 Essential (primary) hypertension: Secondary | ICD-10-CM | POA: Diagnosis not present

## 2015-06-13 DIAGNOSIS — F410 Panic disorder [episodic paroxysmal anxiety] without agoraphobia: Secondary | ICD-10-CM | POA: Diagnosis not present

## 2015-06-13 DIAGNOSIS — E876 Hypokalemia: Secondary | ICD-10-CM | POA: Diagnosis not present

## 2015-06-13 DIAGNOSIS — K5909 Other constipation: Secondary | ICD-10-CM | POA: Diagnosis not present

## 2015-06-13 DIAGNOSIS — F3341 Major depressive disorder, recurrent, in partial remission: Secondary | ICD-10-CM | POA: Diagnosis not present

## 2015-06-13 DIAGNOSIS — M546 Pain in thoracic spine: Secondary | ICD-10-CM | POA: Diagnosis not present

## 2015-06-13 DIAGNOSIS — R768 Other specified abnormal immunological findings in serum: Secondary | ICD-10-CM | POA: Diagnosis not present

## 2015-06-13 DIAGNOSIS — K5900 Constipation, unspecified: Secondary | ICD-10-CM | POA: Diagnosis not present

## 2015-06-13 DIAGNOSIS — M40294 Other kyphosis, thoracic region: Secondary | ICD-10-CM | POA: Diagnosis not present

## 2015-06-13 DIAGNOSIS — M810 Age-related osteoporosis without current pathological fracture: Secondary | ICD-10-CM | POA: Diagnosis not present

## 2015-06-13 DIAGNOSIS — M4035 Flatback syndrome, thoracolumbar region: Secondary | ICD-10-CM | POA: Diagnosis not present

## 2015-06-13 DIAGNOSIS — R200 Anesthesia of skin: Secondary | ICD-10-CM | POA: Diagnosis not present

## 2015-06-13 DIAGNOSIS — M4323 Fusion of spine, cervicothoracic region: Secondary | ICD-10-CM | POA: Diagnosis not present

## 2015-06-13 DIAGNOSIS — N390 Urinary tract infection, site not specified: Secondary | ICD-10-CM | POA: Diagnosis not present

## 2015-06-13 DIAGNOSIS — M47812 Spondylosis without myelopathy or radiculopathy, cervical region: Secondary | ICD-10-CM | POA: Diagnosis not present

## 2015-06-13 DIAGNOSIS — S22009A Unspecified fracture of unspecified thoracic vertebra, initial encounter for closed fracture: Secondary | ICD-10-CM | POA: Diagnosis not present

## 2015-06-13 DIAGNOSIS — F419 Anxiety disorder, unspecified: Secondary | ICD-10-CM | POA: Diagnosis not present

## 2015-06-13 DIAGNOSIS — Z87891 Personal history of nicotine dependence: Secondary | ICD-10-CM | POA: Diagnosis not present

## 2015-06-13 DIAGNOSIS — M25511 Pain in right shoulder: Secondary | ICD-10-CM | POA: Diagnosis not present

## 2015-06-13 DIAGNOSIS — F418 Other specified anxiety disorders: Secondary | ICD-10-CM | POA: Diagnosis not present

## 2015-06-13 DIAGNOSIS — Z981 Arthrodesis status: Secondary | ICD-10-CM | POA: Diagnosis not present

## 2015-06-13 DIAGNOSIS — M960 Pseudarthrosis after fusion or arthrodesis: Secondary | ICD-10-CM | POA: Diagnosis not present

## 2015-06-13 DIAGNOSIS — M549 Dorsalgia, unspecified: Secondary | ICD-10-CM | POA: Diagnosis not present

## 2015-06-13 DIAGNOSIS — K219 Gastro-esophageal reflux disease without esophagitis: Secondary | ICD-10-CM | POA: Diagnosis not present

## 2015-06-13 DIAGNOSIS — M542 Cervicalgia: Secondary | ICD-10-CM | POA: Diagnosis not present

## 2015-06-13 DIAGNOSIS — E785 Hyperlipidemia, unspecified: Secondary | ICD-10-CM | POA: Diagnosis not present

## 2015-06-13 DIAGNOSIS — G8918 Other acute postprocedural pain: Secondary | ICD-10-CM | POA: Diagnosis not present

## 2015-06-13 DIAGNOSIS — Z01818 Encounter for other preprocedural examination: Secondary | ICD-10-CM | POA: Diagnosis not present

## 2015-06-13 DIAGNOSIS — F329 Major depressive disorder, single episode, unspecified: Secondary | ICD-10-CM | POA: Diagnosis not present

## 2015-06-13 DIAGNOSIS — M4030 Flatback syndrome, site unspecified: Secondary | ICD-10-CM | POA: Diagnosis not present

## 2015-06-26 DIAGNOSIS — M4320 Fusion of spine, site unspecified: Secondary | ICD-10-CM | POA: Diagnosis not present

## 2015-06-26 DIAGNOSIS — M546 Pain in thoracic spine: Secondary | ICD-10-CM | POA: Diagnosis not present

## 2015-06-26 DIAGNOSIS — Z981 Arthrodesis status: Secondary | ICD-10-CM | POA: Diagnosis not present

## 2015-06-26 DIAGNOSIS — M542 Cervicalgia: Secondary | ICD-10-CM | POA: Diagnosis not present

## 2015-07-20 DIAGNOSIS — Z23 Encounter for immunization: Secondary | ICD-10-CM | POA: Diagnosis not present

## 2015-07-20 DIAGNOSIS — E785 Hyperlipidemia, unspecified: Secondary | ICD-10-CM | POA: Diagnosis not present

## 2015-07-20 DIAGNOSIS — F411 Generalized anxiety disorder: Secondary | ICD-10-CM | POA: Diagnosis not present

## 2015-07-20 DIAGNOSIS — Z6826 Body mass index (BMI) 26.0-26.9, adult: Secondary | ICD-10-CM | POA: Diagnosis not present

## 2015-07-20 DIAGNOSIS — I10 Essential (primary) hypertension: Secondary | ICD-10-CM | POA: Diagnosis not present

## 2015-07-26 DIAGNOSIS — M545 Low back pain: Secondary | ICD-10-CM | POA: Diagnosis not present

## 2015-08-02 DIAGNOSIS — R3915 Urgency of urination: Secondary | ICD-10-CM | POA: Diagnosis not present

## 2015-08-02 DIAGNOSIS — R300 Dysuria: Secondary | ICD-10-CM | POA: Diagnosis not present

## 2015-08-02 DIAGNOSIS — N3020 Other chronic cystitis without hematuria: Secondary | ICD-10-CM | POA: Diagnosis not present

## 2015-08-02 DIAGNOSIS — N318 Other neuromuscular dysfunction of bladder: Secondary | ICD-10-CM | POA: Diagnosis not present

## 2015-08-20 DIAGNOSIS — Z6826 Body mass index (BMI) 26.0-26.9, adult: Secondary | ICD-10-CM | POA: Diagnosis not present

## 2015-08-20 DIAGNOSIS — M545 Low back pain: Secondary | ICD-10-CM | POA: Diagnosis not present

## 2015-08-20 DIAGNOSIS — F411 Generalized anxiety disorder: Secondary | ICD-10-CM | POA: Diagnosis not present

## 2015-08-20 DIAGNOSIS — M5412 Radiculopathy, cervical region: Secondary | ICD-10-CM | POA: Diagnosis not present

## 2015-08-20 DIAGNOSIS — I10 Essential (primary) hypertension: Secondary | ICD-10-CM | POA: Diagnosis not present

## 2015-09-04 DIAGNOSIS — N390 Urinary tract infection, site not specified: Secondary | ICD-10-CM | POA: Diagnosis not present

## 2015-09-04 DIAGNOSIS — Z6827 Body mass index (BMI) 27.0-27.9, adult: Secondary | ICD-10-CM | POA: Diagnosis not present

## 2015-09-05 DIAGNOSIS — M4327 Fusion of spine, lumbosacral region: Secondary | ICD-10-CM | POA: Diagnosis not present

## 2015-09-05 DIAGNOSIS — M549 Dorsalgia, unspecified: Secondary | ICD-10-CM | POA: Diagnosis not present

## 2015-09-05 DIAGNOSIS — M546 Pain in thoracic spine: Secondary | ICD-10-CM | POA: Diagnosis not present

## 2015-09-05 DIAGNOSIS — M5136 Other intervertebral disc degeneration, lumbar region: Secondary | ICD-10-CM | POA: Diagnosis not present

## 2015-09-05 DIAGNOSIS — G8929 Other chronic pain: Secondary | ICD-10-CM | POA: Diagnosis not present

## 2015-09-05 DIAGNOSIS — M4322 Fusion of spine, cervical region: Secondary | ICD-10-CM | POA: Diagnosis not present

## 2015-09-05 DIAGNOSIS — M542 Cervicalgia: Secondary | ICD-10-CM | POA: Diagnosis not present

## 2015-09-05 DIAGNOSIS — M4325 Fusion of spine, thoracolumbar region: Secondary | ICD-10-CM | POA: Diagnosis not present

## 2015-09-14 DIAGNOSIS — N390 Urinary tract infection, site not specified: Secondary | ICD-10-CM | POA: Diagnosis not present

## 2015-09-14 DIAGNOSIS — Z6827 Body mass index (BMI) 27.0-27.9, adult: Secondary | ICD-10-CM | POA: Diagnosis not present

## 2015-09-17 DIAGNOSIS — Z1389 Encounter for screening for other disorder: Secondary | ICD-10-CM | POA: Diagnosis not present

## 2015-09-17 DIAGNOSIS — Z9181 History of falling: Secondary | ICD-10-CM | POA: Diagnosis not present

## 2015-09-17 DIAGNOSIS — Z6827 Body mass index (BMI) 27.0-27.9, adult: Secondary | ICD-10-CM | POA: Diagnosis not present

## 2015-09-17 DIAGNOSIS — M545 Low back pain: Secondary | ICD-10-CM | POA: Diagnosis not present

## 2015-10-02 DIAGNOSIS — R3915 Urgency of urination: Secondary | ICD-10-CM | POA: Diagnosis not present

## 2015-10-02 DIAGNOSIS — N3090 Cystitis, unspecified without hematuria: Secondary | ICD-10-CM | POA: Diagnosis not present

## 2015-10-02 DIAGNOSIS — R300 Dysuria: Secondary | ICD-10-CM | POA: Diagnosis not present

## 2015-10-02 DIAGNOSIS — N3020 Other chronic cystitis without hematuria: Secondary | ICD-10-CM | POA: Diagnosis not present

## 2015-10-02 DIAGNOSIS — N318 Other neuromuscular dysfunction of bladder: Secondary | ICD-10-CM | POA: Diagnosis not present

## 2015-10-23 DIAGNOSIS — R202 Paresthesia of skin: Secondary | ICD-10-CM | POA: Diagnosis not present

## 2015-10-23 DIAGNOSIS — M79601 Pain in right arm: Secondary | ICD-10-CM | POA: Diagnosis not present

## 2015-10-23 DIAGNOSIS — M542 Cervicalgia: Secondary | ICD-10-CM | POA: Diagnosis not present

## 2015-10-23 DIAGNOSIS — R200 Anesthesia of skin: Secondary | ICD-10-CM | POA: Diagnosis not present

## 2015-10-23 DIAGNOSIS — G8929 Other chronic pain: Secondary | ICD-10-CM | POA: Diagnosis not present

## 2015-10-23 DIAGNOSIS — Z981 Arthrodesis status: Secondary | ICD-10-CM | POA: Diagnosis not present

## 2015-10-23 DIAGNOSIS — M546 Pain in thoracic spine: Secondary | ICD-10-CM | POA: Diagnosis not present

## 2015-10-23 DIAGNOSIS — M545 Low back pain: Secondary | ICD-10-CM | POA: Diagnosis not present

## 2015-10-24 DIAGNOSIS — E785 Hyperlipidemia, unspecified: Secondary | ICD-10-CM | POA: Diagnosis not present

## 2015-10-24 DIAGNOSIS — R350 Frequency of micturition: Secondary | ICD-10-CM | POA: Diagnosis not present

## 2015-10-24 DIAGNOSIS — I10 Essential (primary) hypertension: Secondary | ICD-10-CM | POA: Diagnosis not present

## 2015-10-31 DIAGNOSIS — N3020 Other chronic cystitis without hematuria: Secondary | ICD-10-CM | POA: Diagnosis not present

## 2015-10-31 DIAGNOSIS — R3915 Urgency of urination: Secondary | ICD-10-CM | POA: Diagnosis not present

## 2015-10-31 DIAGNOSIS — R300 Dysuria: Secondary | ICD-10-CM | POA: Diagnosis not present

## 2015-10-31 DIAGNOSIS — N318 Other neuromuscular dysfunction of bladder: Secondary | ICD-10-CM | POA: Diagnosis not present

## 2015-11-09 DIAGNOSIS — E876 Hypokalemia: Secondary | ICD-10-CM | POA: Diagnosis not present

## 2015-11-09 DIAGNOSIS — Z23 Encounter for immunization: Secondary | ICD-10-CM | POA: Diagnosis not present

## 2015-11-09 DIAGNOSIS — G8929 Other chronic pain: Secondary | ICD-10-CM | POA: Diagnosis not present

## 2015-11-09 DIAGNOSIS — R0902 Hypoxemia: Secondary | ICD-10-CM | POA: Diagnosis not present

## 2015-11-09 DIAGNOSIS — R1312 Dysphagia, oropharyngeal phase: Secondary | ICD-10-CM | POA: Diagnosis not present

## 2015-11-09 DIAGNOSIS — R0602 Shortness of breath: Secondary | ICD-10-CM | POA: Diagnosis not present

## 2015-11-09 DIAGNOSIS — A419 Sepsis, unspecified organism: Secondary | ICD-10-CM | POA: Diagnosis not present

## 2015-11-09 DIAGNOSIS — Z87891 Personal history of nicotine dependence: Secondary | ICD-10-CM | POA: Diagnosis not present

## 2015-11-09 DIAGNOSIS — I10 Essential (primary) hypertension: Secondary | ICD-10-CM | POA: Diagnosis not present

## 2015-11-09 DIAGNOSIS — R4182 Altered mental status, unspecified: Secondary | ICD-10-CM | POA: Diagnosis not present

## 2015-11-09 DIAGNOSIS — Z751 Person awaiting admission to adequate facility elsewhere: Secondary | ICD-10-CM | POA: Diagnosis not present

## 2015-11-09 DIAGNOSIS — N390 Urinary tract infection, site not specified: Secondary | ICD-10-CM | POA: Diagnosis not present

## 2015-11-09 DIAGNOSIS — J189 Pneumonia, unspecified organism: Secondary | ICD-10-CM | POA: Diagnosis not present

## 2015-11-09 DIAGNOSIS — J22 Unspecified acute lower respiratory infection: Secondary | ICD-10-CM | POA: Diagnosis not present

## 2015-11-09 DIAGNOSIS — M5440 Lumbago with sciatica, unspecified side: Secondary | ICD-10-CM | POA: Diagnosis not present

## 2015-11-09 DIAGNOSIS — R110 Nausea: Secondary | ICD-10-CM | POA: Diagnosis not present

## 2015-11-09 DIAGNOSIS — Z79899 Other long term (current) drug therapy: Secondary | ICD-10-CM | POA: Diagnosis not present

## 2015-11-09 DIAGNOSIS — E7800 Pure hypercholesterolemia, unspecified: Secondary | ICD-10-CM | POA: Diagnosis not present

## 2015-11-09 DIAGNOSIS — A047 Enterocolitis due to Clostridium difficile: Secondary | ICD-10-CM | POA: Diagnosis not present

## 2015-11-09 DIAGNOSIS — R6520 Severe sepsis without septic shock: Secondary | ICD-10-CM | POA: Diagnosis not present

## 2015-11-09 DIAGNOSIS — N3000 Acute cystitis without hematuria: Secondary | ICD-10-CM | POA: Diagnosis not present

## 2015-11-09 DIAGNOSIS — G9341 Metabolic encephalopathy: Secondary | ICD-10-CM | POA: Diagnosis not present

## 2015-11-09 DIAGNOSIS — R918 Other nonspecific abnormal finding of lung field: Secondary | ICD-10-CM | POA: Diagnosis not present

## 2015-11-14 DIAGNOSIS — J22 Unspecified acute lower respiratory infection: Secondary | ICD-10-CM | POA: Diagnosis not present

## 2015-11-14 DIAGNOSIS — R0902 Hypoxemia: Secondary | ICD-10-CM | POA: Diagnosis not present

## 2015-11-14 DIAGNOSIS — Z751 Person awaiting admission to adequate facility elsewhere: Secondary | ICD-10-CM | POA: Diagnosis not present

## 2015-11-14 DIAGNOSIS — M5440 Lumbago with sciatica, unspecified side: Secondary | ICD-10-CM | POA: Diagnosis not present

## 2015-11-14 DIAGNOSIS — Z23 Encounter for immunization: Secondary | ICD-10-CM | POA: Diagnosis not present

## 2015-11-14 DIAGNOSIS — G9341 Metabolic encephalopathy: Secondary | ICD-10-CM | POA: Diagnosis not present

## 2015-11-14 DIAGNOSIS — E7800 Pure hypercholesterolemia, unspecified: Secondary | ICD-10-CM | POA: Diagnosis not present

## 2015-11-14 DIAGNOSIS — A419 Sepsis, unspecified organism: Secondary | ICD-10-CM | POA: Diagnosis not present

## 2015-11-14 DIAGNOSIS — I10 Essential (primary) hypertension: Secondary | ICD-10-CM | POA: Diagnosis not present

## 2015-11-14 DIAGNOSIS — E876 Hypokalemia: Secondary | ICD-10-CM | POA: Diagnosis not present

## 2015-11-14 DIAGNOSIS — R6520 Severe sepsis without septic shock: Secondary | ICD-10-CM | POA: Diagnosis not present

## 2015-11-14 DIAGNOSIS — G8929 Other chronic pain: Secondary | ICD-10-CM | POA: Diagnosis not present

## 2015-11-14 DIAGNOSIS — Z79899 Other long term (current) drug therapy: Secondary | ICD-10-CM | POA: Diagnosis not present

## 2015-11-14 DIAGNOSIS — R1312 Dysphagia, oropharyngeal phase: Secondary | ICD-10-CM | POA: Diagnosis not present

## 2015-11-14 DIAGNOSIS — Z87891 Personal history of nicotine dependence: Secondary | ICD-10-CM | POA: Diagnosis not present

## 2015-11-20 DIAGNOSIS — I10 Essential (primary) hypertension: Secondary | ICD-10-CM | POA: Diagnosis not present

## 2015-11-20 DIAGNOSIS — Z6827 Body mass index (BMI) 27.0-27.9, adult: Secondary | ICD-10-CM | POA: Diagnosis not present

## 2015-11-20 DIAGNOSIS — R600 Localized edema: Secondary | ICD-10-CM | POA: Diagnosis not present

## 2015-11-20 DIAGNOSIS — A047 Enterocolitis due to Clostridium difficile: Secondary | ICD-10-CM | POA: Diagnosis not present

## 2015-11-20 DIAGNOSIS — G8929 Other chronic pain: Secondary | ICD-10-CM | POA: Diagnosis not present

## 2015-11-20 DIAGNOSIS — M5441 Lumbago with sciatica, right side: Secondary | ICD-10-CM | POA: Diagnosis not present

## 2015-11-29 DIAGNOSIS — M5441 Lumbago with sciatica, right side: Secondary | ICD-10-CM | POA: Diagnosis not present

## 2015-11-29 DIAGNOSIS — G8929 Other chronic pain: Secondary | ICD-10-CM | POA: Diagnosis not present

## 2015-11-29 DIAGNOSIS — I10 Essential (primary) hypertension: Secondary | ICD-10-CM | POA: Diagnosis not present

## 2015-11-30 DIAGNOSIS — M5441 Lumbago with sciatica, right side: Secondary | ICD-10-CM | POA: Diagnosis not present

## 2015-11-30 DIAGNOSIS — I10 Essential (primary) hypertension: Secondary | ICD-10-CM | POA: Diagnosis not present

## 2015-11-30 DIAGNOSIS — G8929 Other chronic pain: Secondary | ICD-10-CM | POA: Diagnosis not present

## 2015-12-03 DIAGNOSIS — M5441 Lumbago with sciatica, right side: Secondary | ICD-10-CM | POA: Diagnosis not present

## 2015-12-03 DIAGNOSIS — I10 Essential (primary) hypertension: Secondary | ICD-10-CM | POA: Diagnosis not present

## 2015-12-03 DIAGNOSIS — G8929 Other chronic pain: Secondary | ICD-10-CM | POA: Diagnosis not present

## 2015-12-05 DIAGNOSIS — R531 Weakness: Secondary | ICD-10-CM | POA: Diagnosis not present

## 2015-12-05 DIAGNOSIS — R600 Localized edema: Secondary | ICD-10-CM | POA: Diagnosis not present

## 2015-12-05 DIAGNOSIS — R42 Dizziness and giddiness: Secondary | ICD-10-CM | POA: Diagnosis not present

## 2015-12-06 DIAGNOSIS — G8929 Other chronic pain: Secondary | ICD-10-CM | POA: Diagnosis not present

## 2015-12-06 DIAGNOSIS — I10 Essential (primary) hypertension: Secondary | ICD-10-CM | POA: Diagnosis not present

## 2015-12-06 DIAGNOSIS — M5441 Lumbago with sciatica, right side: Secondary | ICD-10-CM | POA: Diagnosis not present

## 2015-12-10 DIAGNOSIS — M419 Scoliosis, unspecified: Secondary | ICD-10-CM | POA: Diagnosis not present

## 2015-12-10 DIAGNOSIS — M549 Dorsalgia, unspecified: Secondary | ICD-10-CM | POA: Diagnosis not present

## 2015-12-10 DIAGNOSIS — Z981 Arthrodesis status: Secondary | ICD-10-CM | POA: Diagnosis not present

## 2015-12-11 DIAGNOSIS — I10 Essential (primary) hypertension: Secondary | ICD-10-CM | POA: Diagnosis not present

## 2015-12-11 DIAGNOSIS — G8929 Other chronic pain: Secondary | ICD-10-CM | POA: Diagnosis not present

## 2015-12-11 DIAGNOSIS — M5441 Lumbago with sciatica, right side: Secondary | ICD-10-CM | POA: Diagnosis not present

## 2015-12-12 DIAGNOSIS — Z6827 Body mass index (BMI) 27.0-27.9, adult: Secondary | ICD-10-CM | POA: Diagnosis not present

## 2015-12-12 DIAGNOSIS — F411 Generalized anxiety disorder: Secondary | ICD-10-CM | POA: Diagnosis not present

## 2015-12-13 DIAGNOSIS — I10 Essential (primary) hypertension: Secondary | ICD-10-CM | POA: Diagnosis not present

## 2015-12-13 DIAGNOSIS — M5441 Lumbago with sciatica, right side: Secondary | ICD-10-CM | POA: Diagnosis not present

## 2015-12-13 DIAGNOSIS — G8929 Other chronic pain: Secondary | ICD-10-CM | POA: Diagnosis not present

## 2015-12-18 DIAGNOSIS — M5441 Lumbago with sciatica, right side: Secondary | ICD-10-CM | POA: Diagnosis not present

## 2015-12-18 DIAGNOSIS — G8929 Other chronic pain: Secondary | ICD-10-CM | POA: Diagnosis not present

## 2015-12-18 DIAGNOSIS — I10 Essential (primary) hypertension: Secondary | ICD-10-CM | POA: Diagnosis not present

## 2019-03-23 ENCOUNTER — Ambulatory Visit (INDEPENDENT_AMBULATORY_CARE_PROVIDER_SITE_OTHER): Payer: Medicare Other | Admitting: Cardiology

## 2019-03-23 ENCOUNTER — Encounter: Payer: Self-pay | Admitting: Cardiology

## 2019-03-23 ENCOUNTER — Other Ambulatory Visit: Payer: Self-pay

## 2019-03-23 DIAGNOSIS — Z01810 Encounter for preprocedural cardiovascular examination: Secondary | ICD-10-CM | POA: Diagnosis not present

## 2019-03-23 DIAGNOSIS — Z87891 Personal history of nicotine dependence: Secondary | ICD-10-CM

## 2019-03-23 DIAGNOSIS — E785 Hyperlipidemia, unspecified: Secondary | ICD-10-CM | POA: Diagnosis not present

## 2019-03-23 NOTE — Patient Instructions (Signed)
Medication Instructions:  Your physician recommends that you continue on your current medications as directed. Please refer to the Current Medication list given to you today.  If you need a refill on your cardiac medications before your next appointment, please call your pharmacy.   Lab work: None ordered If you have labs (blood work) drawn today and your tests are completely normal, you will receive your results only by: Marland Kitchen MyChart Message (if you have MyChart) OR . A paper copy in the mail If you have any lab test that is abnormal or we need to change your treatment, we will call you to review the results.  Testing/Procedures: EKG Today  Your physician has requested that you have an echocardiogram. Echocardiography is a painless test that uses sound waves to create images of your heart. It provides your doctor with information about the size and shape of your heart and how well your heart's chambers and valves are working. This procedure takes approximately one hour. There are no restrictions for this procedure.  Your physician has requested that you have a lexiscan myoview. For further information please visit [email or web address] Please follow instruction sheet, as given.  Follow-Up: At [facility], you and your health needs are our priority.  As part of our continuing mission to provide you with exceptional heart care, we have created designated Provider Care Teams.  These Care Teams include your primary Cardiologist (physician) and Advanced Practice Providers (APPs -  Physician Assistants and Nurse Practitioners) who all work together to provide you with the care you need, when you need it. You will need a follow up appointment in 3 months. You may see Gypsy Balsam or another member of our [employer] Provider Team in [place]: Norman Herrlich, MD . Belva Crome, MD

## 2019-03-23 NOTE — Progress Notes (Signed)
Cardiology Consultation:    Date:  03/23/2019   ID:  Sandy CasaLinda M Sheard, DOB [birth year], MRN [id number]  PCP:  Hurshel PartyMoon, Amy A, NP  Cardiologist:  Gypsy Balsamobert Krasowski, MD   Referring MD: Hurshel PartyMoon, Amy A, NP   Chief Complaint  Patient presents with  . Pre-op Exam  I did back surgery  History of Present Illness:    Sandy Wood is a 78 y.o. female who is being seen today for the evaluation of preop evaluation before back surgery at the request of Moon, Amy A, NP.  She is absolutely delightful lady that is been suffering with back problem for years she already got multiple back surgeries.  She is scheduled to have another surgeries.  The few things that prompted this visit for evaluation before her surgery.  She does have dyslipidemia which is poorly controlled she did have some intolerance to some of cholesterol-lowering medication, she has remote history of smoking, hypertension.  She does described to have a lot of pain because of chronic back problem denies having any typical chest pain.  Her exercise ability is severely limited because of back problem.  She cannot walk much.  She described also to have some swelling of lower extremities partially is probably related to surgery that she got at her uncle however both ankles are swollen with some minimal pretibial edema.  She is wearing elastic stockings today therefore somewhat difficult to judge but even though her legs are swollen.  She does not have any proximal nocturnal dyspnea.  She does have exertional shortness of breath but what limits her exercise is back pain.  She never had any cardiac testing done never suffered any myocardial infarction or other problems.  No past medical history on file.    Current Medications: Current Meds  Medication Sig  . AMITIZA 24 MCG capsule Take 1 capsule by mouth daily.  . baclofen (LIORESAL) 10 MG tablet Take 1 tablet by mouth 3 (three) times daily.  . busPIRone (BUSPAR) 15 MG tablet Take 1 tablet by mouth 2  (two) times daily.  Marland Kitchen. doxepin (SINEQUAN) 10 MG capsule Take 1 capsule by mouth at bedtime.  . fluticasone (FLONASE) 50 MCG/ACT nasal spray Place 1-2 sprays into the nose daily.  . fluticasone (FLOVENT DISKUS) 50 MCG/BLIST diskus inhaler Inhale 1 puff into the lungs 2 (two) times daily.  . hydrochlorothiazide (HYDRODIURIL) 25 MG tablet Take 37.5 mg by mouth daily.  . hydrOXYzine (ATARAX/VISTARIL) 25 MG tablet Take 25 mg by mouth every 6 (six) hours as needed.  . labetalol (NORMODYNE) 200 MG tablet Take 1 tablet by mouth 2 (two) times daily.  Marland Kitchen. losartan (COZAAR) 100 MG tablet Take 1 tablet by mouth daily.  . methocarbamol (ROBAXIN) 750 MG tablet Take 1 tablet by mouth every 4 (four) hours as needed.  . Milk Thistle Extract 175 MG TABS Take 1 tablet by mouth daily.  Marland Kitchen. omeprazole (PRILOSEC) 40 MG capsule Take 1 capsule by mouth daily.  Marland Kitchen. oxyCODONE-acetaminophen (PERCOCET) 10-325 MG tablet Take 1 tablet by mouth every 6 (six) hours.  . potassium chloride SA (K-DUR) 20 MEQ tablet Take 1 tablet by mouth 2 (two) times daily.  . pravastatin (PRAVACHOL) 80 MG tablet Take 1 tablet by mouth daily.  . prochlorperazine (COMPAZINE) 10 MG tablet Take 1 tablet by mouth as needed.  . senna-docusate (SENOKOT-S) 8.6-50 MG tablet Take 3 tablets by mouth 2 (two) times daily.  . vitamin B-12 (CYANOCOBALAMIN) 1000 MCG tablet Take 1 tablet by mouth daily.  Allergies:   Nifedipine; Nitrofurantoin; Promethazine; Cefprozil; Diphenhydramine hcl; Gabapentin; Sulfamethoxazole-trimethoprim; and Zolpidem   Social History   Socioeconomic History  . Marital status: Married    Spouse name: Not on file  . Number of children: Not on file  . Years of education: Not on file  . Highest education level: Not on file  Occupational History  . Not on file  Social Needs  . Financial resource strain: Not on file  . Food insecurity:    Worry: Not on file    Inability: Not on file  . Transportation needs:    Medical: Not on  file    Non-medical: Not on file  Tobacco Use  . Smoking status: Former Games developer  . Smokeless tobacco: Never Used  Substance and Sexual Activity  . Alcohol use: Never    Frequency: Never  . Drug use: Never  . Sexual activity: Not on file  Lifestyle  . Physical activity:    Days per week: Not on file    Minutes per session: Not on file  . Stress: Not on file  Relationships  . Social connections:    Talks on phone: Not on file    Gets together: Not on file    Attends religious service: Not on file    Active member of club or organization: Not on file    Attends meetings of clubs or organizations: Not on file    Relationship status: Not on file  Other Topics Concern  . Not on file  Social History Narrative  . Not on file     Family History: The patient's family history is not on file. ROS:   Please see the history of present illness.    All 14 point review of systems negative except as described per history of present illness.  EKGs/Labs/Other Studies Reviewed:    The following studies were reviewed today:   EKG:  EKG is  ordered today.  The ekg ordered today demonstrates normal sinus rhythm normal P interval poor R wave progression anterior precordium raising suspicion for old anterior septal wall myocardial infarction, nonspecific ST segment changes  Recent Labs: No results found for requested labs within last 8760 hours.  Recent Lipid Panel No results found for: CHOL, TRIG, HDL, CHOLHDL, VLDL, LDLCALC, LDLDIRECT  Physical Exam:    VS:  BP (!) 150/70   Pulse 78   Ht 5\' 7"  (1.702 m)   Wt 174 lb (78.9 kg)   SpO2 98%   BMI 27.25 kg/m     Wt Readings from Last 3 Encounters:  03/23/19 174 lb (78.9 kg)     GEN:  Well nourished, well developed in no acute distress HEENT: Normal NECK: No JVD; No carotid bruits LYMPHATICS: No lymphadenopathy CARDIAC: RRR, soft systolic murmur best heard left border sternum grade 1/6 without radiation, no rubs, no gallops  RESPIRATORY:  Clear to auscultation without rales, wheezing or rhonchi  ABDOMEN: Soft, non-tender, non-distended MUSCULOSKELETAL:  No edema; No deformity  SKIN: Warm and dry NEUROLOGIC:  Alert and oriented x 3 PSYCHIATRIC:  Normal affect   ASSESSMENT:    1. Preop cardiovascular exam   2. Dyslipidemia   3. History of smoking    PLAN:    In order of problems listed above:  1. Preop cardiovascular evaluation.  This surgery overall is consider mild to moderate risk the fact that she required general anesthesia carries some cardiovascular risk therefore I think proper evaluation is required.  I cannot assess her based on her  ability to exercise because she is not able to exercise secondary to her back problem.  In my opinion she required echocardiogram to assess left ventricular ejection fraction, also check for any evidence of microinfarction as suggested by EKG, also look at her valves to make sure there is no significant valvular problem.  She does have soft murmur however I do not think we will find any significant valvular problems.  As a part of evaluation she need to have stress test done to make sure she does not have significant coronary artery disease I am doing this especially in view of the fact that she got numerous risk factor for it which include dyslipidemia history of smoking as well as hypertension.  If those 2 tests find there should be no problem proceed with surgery. 2. Dyslipidemia her fasting lipid profile is still not perfectly controlled.  She takes pravastatin 80 mg which is considered moderate intensity statin based on results of her stress test will decide about aggressiveness of this therapy.  LDL was 115 HDL was 33. 3. History of smoking does not smoke anymore.  I encouraged her to stay away from this habit.   Medication Adjustments/Labs and Tests Ordered: Current medicines are reviewed at length with the patient today.  Concerns regarding medicines are outlined  above.  No orders of the defined types were placed in this encounter.  No orders of the defined types were placed in this encounter.   Signed, Georgeanna Lea, MD, [facility]. 03/23/2019 1:29 PM    [facility]

## 2019-04-15 ENCOUNTER — Telehealth (HOSPITAL_COMMUNITY): Payer: Self-pay | Admitting: *Deleted

## 2019-04-15 NOTE — Telephone Encounter (Signed)
Called patient to review instructions for upcoming nuclear stress test. Patient wants to cancel,  States she will call the [place] office to cancel.  Kirstie Peri

## 2019-04-21 ENCOUNTER — Other Ambulatory Visit: Payer: Medicare Other

## 2019-06-23 ENCOUNTER — Ambulatory Visit: Payer: Medicare Other | Admitting: Cardiology

## 2019-12-19 DIAGNOSIS — Z79899 Other long term (current) drug therapy: Secondary | ICD-10-CM | POA: Diagnosis not present

## 2019-12-19 DIAGNOSIS — I5022 Chronic systolic (congestive) heart failure: Secondary | ICD-10-CM | POA: Diagnosis not present

## 2019-12-19 DIAGNOSIS — D649 Anemia, unspecified: Secondary | ICD-10-CM | POA: Diagnosis not present

## 2019-12-19 DIAGNOSIS — R0602 Shortness of breath: Secondary | ICD-10-CM | POA: Diagnosis not present

## 2019-12-20 DIAGNOSIS — I5022 Chronic systolic (congestive) heart failure: Secondary | ICD-10-CM | POA: Diagnosis not present

## 2019-12-20 DIAGNOSIS — M6281 Muscle weakness (generalized): Secondary | ICD-10-CM | POA: Diagnosis not present

## 2019-12-20 DIAGNOSIS — R0602 Shortness of breath: Secondary | ICD-10-CM | POA: Diagnosis not present

## 2019-12-20 DIAGNOSIS — R110 Nausea: Secondary | ICD-10-CM | POA: Diagnosis not present

## 2019-12-20 DIAGNOSIS — U071 COVID-19: Secondary | ICD-10-CM | POA: Diagnosis not present

## 2019-12-20 DIAGNOSIS — K224 Dyskinesia of esophagus: Secondary | ICD-10-CM | POA: Diagnosis not present

## 2019-12-20 DIAGNOSIS — Z7409 Other reduced mobility: Secondary | ICD-10-CM | POA: Diagnosis not present

## 2019-12-20 DIAGNOSIS — D649 Anemia, unspecified: Secondary | ICD-10-CM | POA: Diagnosis not present

## 2019-12-20 DIAGNOSIS — R1310 Dysphagia, unspecified: Secondary | ICD-10-CM | POA: Diagnosis not present

## 2019-12-26 DIAGNOSIS — H04123 Dry eye syndrome of bilateral lacrimal glands: Secondary | ICD-10-CM | POA: Diagnosis not present

## 2019-12-26 DIAGNOSIS — Z7901 Long term (current) use of anticoagulants: Secondary | ICD-10-CM | POA: Diagnosis not present

## 2019-12-26 DIAGNOSIS — Z961 Presence of intraocular lens: Secondary | ICD-10-CM | POA: Diagnosis not present

## 2019-12-26 DIAGNOSIS — H524 Presbyopia: Secondary | ICD-10-CM | POA: Diagnosis not present

## 2019-12-27 DIAGNOSIS — I82412 Acute embolism and thrombosis of left femoral vein: Secondary | ICD-10-CM | POA: Diagnosis not present

## 2019-12-27 DIAGNOSIS — G8929 Other chronic pain: Secondary | ICD-10-CM | POA: Diagnosis not present

## 2019-12-27 DIAGNOSIS — K224 Dyskinesia of esophagus: Secondary | ICD-10-CM | POA: Diagnosis not present

## 2019-12-27 DIAGNOSIS — I5041 Acute combined systolic (congestive) and diastolic (congestive) heart failure: Secondary | ICD-10-CM | POA: Diagnosis not present

## 2020-01-03 DIAGNOSIS — I5041 Acute combined systolic (congestive) and diastolic (congestive) heart failure: Secondary | ICD-10-CM | POA: Diagnosis not present

## 2020-01-03 DIAGNOSIS — K224 Dyskinesia of esophagus: Secondary | ICD-10-CM | POA: Diagnosis not present

## 2020-01-03 DIAGNOSIS — I25110 Atherosclerotic heart disease of native coronary artery with unstable angina pectoris: Secondary | ICD-10-CM | POA: Diagnosis not present

## 2020-01-03 DIAGNOSIS — J439 Emphysema, unspecified: Secondary | ICD-10-CM | POA: Diagnosis not present

## 2020-01-03 DIAGNOSIS — I82412 Acute embolism and thrombosis of left femoral vein: Secondary | ICD-10-CM | POA: Diagnosis not present

## 2020-01-09 DIAGNOSIS — I2510 Atherosclerotic heart disease of native coronary artery without angina pectoris: Secondary | ICD-10-CM | POA: Diagnosis not present

## 2020-01-09 DIAGNOSIS — Z79899 Other long term (current) drug therapy: Secondary | ICD-10-CM | POA: Diagnosis not present

## 2020-01-09 DIAGNOSIS — R0602 Shortness of breath: Secondary | ICD-10-CM | POA: Diagnosis not present

## 2020-01-12 DIAGNOSIS — M25572 Pain in left ankle and joints of left foot: Secondary | ICD-10-CM | POA: Diagnosis not present

## 2020-01-12 DIAGNOSIS — M79672 Pain in left foot: Secondary | ICD-10-CM | POA: Diagnosis not present

## 2020-01-25 DIAGNOSIS — D508 Other iron deficiency anemias: Secondary | ICD-10-CM | POA: Diagnosis not present

## 2020-01-25 DIAGNOSIS — I739 Peripheral vascular disease, unspecified: Secondary | ICD-10-CM | POA: Diagnosis not present

## 2020-01-25 DIAGNOSIS — I82412 Acute embolism and thrombosis of left femoral vein: Secondary | ICD-10-CM | POA: Diagnosis not present

## 2020-01-25 DIAGNOSIS — J438 Other emphysema: Secondary | ICD-10-CM | POA: Diagnosis not present

## 2020-01-25 DIAGNOSIS — I25110 Atherosclerotic heart disease of native coronary artery with unstable angina pectoris: Secondary | ICD-10-CM | POA: Diagnosis not present

## 2020-01-25 DIAGNOSIS — I5041 Acute combined systolic (congestive) and diastolic (congestive) heart failure: Secondary | ICD-10-CM | POA: Diagnosis not present

## 2020-02-06 DIAGNOSIS — Z79899 Other long term (current) drug therapy: Secondary | ICD-10-CM | POA: Diagnosis not present

## 2020-02-06 DIAGNOSIS — D649 Anemia, unspecified: Secondary | ICD-10-CM | POA: Diagnosis not present

## 2020-02-06 DIAGNOSIS — R0602 Shortness of breath: Secondary | ICD-10-CM | POA: Diagnosis not present

## 2020-02-06 DIAGNOSIS — I5040 Unspecified combined systolic (congestive) and diastolic (congestive) heart failure: Secondary | ICD-10-CM | POA: Diagnosis not present

## 2020-02-08 DIAGNOSIS — I255 Ischemic cardiomyopathy: Secondary | ICD-10-CM | POA: Diagnosis not present

## 2020-02-08 DIAGNOSIS — J449 Chronic obstructive pulmonary disease, unspecified: Secondary | ICD-10-CM | POA: Diagnosis not present

## 2020-02-08 DIAGNOSIS — I2510 Atherosclerotic heart disease of native coronary artery without angina pectoris: Secondary | ICD-10-CM | POA: Diagnosis not present

## 2020-02-08 DIAGNOSIS — R54 Age-related physical debility: Secondary | ICD-10-CM | POA: Diagnosis not present

## 2020-02-08 DIAGNOSIS — E785 Hyperlipidemia, unspecified: Secondary | ICD-10-CM | POA: Diagnosis not present

## 2020-02-13 DIAGNOSIS — D649 Anemia, unspecified: Secondary | ICD-10-CM | POA: Diagnosis not present

## 2020-02-13 DIAGNOSIS — I5040 Unspecified combined systolic (congestive) and diastolic (congestive) heart failure: Secondary | ICD-10-CM | POA: Diagnosis not present

## 2020-02-17 DIAGNOSIS — I25110 Atherosclerotic heart disease of native coronary artery with unstable angina pectoris: Secondary | ICD-10-CM | POA: Diagnosis not present

## 2020-02-17 DIAGNOSIS — I5041 Acute combined systolic (congestive) and diastolic (congestive) heart failure: Secondary | ICD-10-CM | POA: Diagnosis not present

## 2020-02-17 DIAGNOSIS — H1132 Conjunctival hemorrhage, left eye: Secondary | ICD-10-CM | POA: Diagnosis not present

## 2020-02-17 DIAGNOSIS — I82412 Acute embolism and thrombosis of left femoral vein: Secondary | ICD-10-CM | POA: Diagnosis not present

## 2020-02-17 DIAGNOSIS — K224 Dyskinesia of esophagus: Secondary | ICD-10-CM | POA: Diagnosis not present

## 2024-02-18 DEATH — deceased
